# Patient Record
Sex: Female | Born: 1970
Health system: Southern US, Community
[De-identification: ages and names within clinical notes are randomized; demographics above are authoritative.]

## PROBLEM LIST (undated history)

## (undated) DIAGNOSIS — E079 Disorder of thyroid, unspecified: Secondary | ICD-10-CM

## (undated) DIAGNOSIS — Z87442 Personal history of urinary calculi: Secondary | ICD-10-CM

## (undated) DIAGNOSIS — D649 Anemia, unspecified: Secondary | ICD-10-CM

## (undated) DIAGNOSIS — Z8619 Personal history of other infectious and parasitic diseases: Secondary | ICD-10-CM

## (undated) DIAGNOSIS — T7840XA Allergy, unspecified, initial encounter: Secondary | ICD-10-CM

## (undated) HISTORY — DX: Anemia, unspecified: D64.9

## (undated) HISTORY — PX: HERNIA REPAIR: SHX51

## (undated) HISTORY — PX: TONSILLECTOMY AND ADENOIDECTOMY: SHX28

## (undated) HISTORY — DX: Disorder of thyroid, unspecified: E07.9

## (undated) HISTORY — DX: Personal history of urinary calculi: Z87.442

## (undated) HISTORY — PX: INCISION AND DRAINAGE RETROPHYARYNGEAL ABCESS: SHX281

## (undated) HISTORY — PX: ENDOMETRIAL BIOPSY: SHX622

## (undated) HISTORY — DX: Allergy, unspecified, initial encounter: T78.40XA

## (undated) HISTORY — PX: WISDOM TOOTH EXTRACTION: SHX21

## (undated) HISTORY — DX: Personal history of other infectious and parasitic diseases: Z86.19

---

## 2004-03-23 ENCOUNTER — Other Ambulatory Visit: Admission: RE | Admit: 2004-03-23 | Discharge: 2004-03-23 | Payer: Self-pay | Admitting: Obstetrics and Gynecology

## 2004-09-23 HISTORY — PX: KNEE ARTHROSCOPY WITH ANTERIOR CRUCIATE LIGAMENT (ACL) REPAIR: SHX5644

## 2014-01-27 ENCOUNTER — Ambulatory Visit (INDEPENDENT_AMBULATORY_CARE_PROVIDER_SITE_OTHER): Payer: 59 | Admitting: Family Medicine

## 2014-01-27 VITALS — BP 118/70 | HR 64 | Temp 98.6°F | Resp 16 | Ht 63.0 in | Wt 154.9 lb

## 2014-01-27 DIAGNOSIS — J329 Chronic sinusitis, unspecified: Secondary | ICD-10-CM

## 2014-01-27 DIAGNOSIS — E039 Hypothyroidism, unspecified: Secondary | ICD-10-CM | POA: Insufficient documentation

## 2014-01-27 DIAGNOSIS — D509 Iron deficiency anemia, unspecified: Secondary | ICD-10-CM | POA: Insufficient documentation

## 2014-01-27 MED ORDER — AMOXICILLIN 875 MG PO TABS: 875.0000 mg | ORAL_TABLET | Freq: Two times a day (BID) | ORAL | Status: DC

## 2014-01-27 NOTE — Progress Notes (Signed)
   Subjective:    Patient ID: Alice PentonRobin Salomon, female    DOB: [birth year], 43 y.o.   MRN: [id number]  HPI Patient presents today with 11 day history cough, head pressure, is feeling worse, with headache over eyes and fatigue. Occasional cough- non productive. Felt bad for 4-5 days then felt better and is now feeling worse.  Occasionally has seasonal allergies. Eyes burning, watery. Took OTC sinus preparation with decongestant and acetaminophen.  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  Review of Systems No sore throat, no fever, feels congested, no chest pain, no wheeze, no SOB, +post nasal drainage    Objective:   Physical Exam  Vitals reviewed. Constitutional: She is oriented to person, place, and time. She appears well-developed and well-nourished.  HENT:  Head: Normocephalic and atraumatic.  Right Ear: Tympanic membrane, external ear and ear canal normal.  Left Ear: Tympanic membrane, external ear and ear canal normal.  Nose: Mucosal edema and rhinorrhea present. Right sinus exhibits no maxillary sinus tenderness and no frontal sinus tenderness. Left sinus exhibits no maxillary sinus tenderness and no frontal sinus tenderness.  Mouth/Throat: Uvula is midline and mucous membranes are normal. Oropharyngeal exudate present. No posterior oropharyngeal edema, posterior oropharyngeal erythema or tonsillar abscesses.  Eyes: Conjunctivae are normal. Right eye exhibits no discharge. Left eye exhibits no discharge.  Neck: Normal range of motion. Neck supple.  Cardiovascular: Normal rate, regular rhythm and normal heart sounds.   Pulmonary/Chest: Effort normal and breath sounds normal.  Musculoskeletal: Normal range of motion.  Lymphadenopathy:    She has no cervical adenopathy.  Neurological: She is alert and oriented to person, place, and time.  Skin: Skin is warm and  dry.  Psychiatric: She has a normal mood and affect. Her behavior is normal. Judgment and thought content normal.       Assessment & Plan:  1. Sinus infection -This could be viral, but with patient's tobacco use, 10+ day history and improvement followed by worsening, will cover with antibiotic. - amoxicillin (AMOXIL) 875 MG tablet; Take 1 tablet (875 mg total) by mouth 2 (two) times daily.  Dispense: 20 tablet; Refill: 0 - Patient Instructions  Sudafed- 1 tablet in morning 1 tablet after lunch as needed for congestion Ibuprofen 400 mg every 8 hours as need for pain/headache  -RTC if no improvement in 3-5 days with antibiotics/OTCs or if worsening sympotms.  Emi Belfasteborah B. Kamayah Pillay, FNP-BC  [facility], [facility]  01/27/2014 5:54 PM

## 2014-01-27 NOTE — Patient Instructions (Signed)
Sudafed- 1 tablet in morning 1 tablet after lunch as needed for congestion Ibuprofen 400 mg every 8 hours as need for pain/headache

## 2014-01-28 NOTE — Progress Notes (Signed)
I have discussed this case with Ms. Gessner, NP and agree.  

## 2015-01-30 ENCOUNTER — Telehealth: Payer: Self-pay | Admitting: *Deleted

## 2015-01-30 NOTE — Telephone Encounter (Signed)
Unable to reach patient at time of Pre-Visit Call.  Left message for patient to return call when available.    

## 2015-01-31 ENCOUNTER — Ambulatory Visit (INDEPENDENT_AMBULATORY_CARE_PROVIDER_SITE_OTHER): Payer: 59 | Admitting: Family

## 2015-01-31 ENCOUNTER — Encounter: Payer: Self-pay | Admitting: Family

## 2015-01-31 VITALS — BP 118/80 | HR 66 | Temp 98.0°F | Resp 16 | Ht 63.75 in | Wt 156.0 lb

## 2015-01-31 DIAGNOSIS — E039 Hypothyroidism, unspecified: Secondary | ICD-10-CM

## 2015-01-31 DIAGNOSIS — D509 Iron deficiency anemia, unspecified: Secondary | ICD-10-CM | POA: Diagnosis not present

## 2015-01-31 DIAGNOSIS — B002 Herpesviral gingivostomatitis and pharyngotonsillitis: Secondary | ICD-10-CM

## 2015-01-31 DIAGNOSIS — Z720 Tobacco use: Secondary | ICD-10-CM | POA: Diagnosis not present

## 2015-01-31 MED ORDER — VALACYCLOVIR HCL 1 G PO TABS: ORAL_TABLET | ORAL | Status: DC

## 2015-01-31 MED ORDER — VARENICLINE TARTRATE 0.5 MG X 11 & 1 MG X 42 PO MISC: ORAL | Status: DC

## 2015-01-31 NOTE — Assessment & Plan Note (Signed)
Attempt trial of chantix when she is ready.  Common side effects including rare risk of suicide ideation was discussed with the patient today.  Patient is instructed to go directly to the ED if this occurs.  We discussed that patient can continue to smoke for 1 week after starting chantix, but then must discontinue cigarettes.  He is also instructed to contact us prior to completion of the starter month pack for an rx for the continuation month pack.  5 minutes spent with patient today on tobacco cessation counseling.

## 2015-01-31 NOTE — Patient Instructions (Signed)
Please complete lab work prior to leaving. Schedule physical at the front desk.  Welcome to [facility]!

## 2015-01-31 NOTE — Assessment & Plan Note (Signed)
Follow up lab work that she brings today shows normal H/H. She now has an IUD and this has decreased her previously heavy menstrual bleeding.

## 2015-01-31 NOTE — Assessment & Plan Note (Signed)
Will refill valtrex 

## 2015-01-31 NOTE — Progress Notes (Signed)
Subjective:    Patient ID: Alice Thomas, female    DOB: May 21, 1971, 44 y.o.   MRN: [id number]  HPI   Alice Thomas is a 44 yr old female who presents today to establish care.   Pmhx is significant for the following:  1) Hypothyroid-  Maintained on levothyroxine. Pt reports that she has been on synthroid for about 5 years ago.    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  Has cold sore every 3-4 months  3) Anemia- Was having having heavy periods   Reports in 1993 she had wisdom teeth removed and developed neck abscess which required hospitalized x 9 days.   4) tobacco abuse-  She is contemplating quitting.   Review of Systems  Constitutional:       Some weight gain in last 6 months.  Wt Readings from Last 3 Encounters: 01/31/15 : 156 lb (70.761 kg) 01/27/14 : 154 lb 14.4 oz (70.262 kg)   HENT: Negative for hearing loss and rhinorrhea.   Eyes: Negative for visual disturbance.  Respiratory: Negative for cough and shortness of breath.   Cardiovascular: Negative for chest pain.  Gastrointestinal: Negative for diarrhea and constipation.  Genitourinary: Negative for dysuria, frequency and menstrual problem.  Musculoskeletal: Negative for myalgias and arthralgias.  Skin: Negative for rash.  Neurological: Negative for headaches.  Hematological: Negative for adenopathy.  Psychiatric/Behavioral:       See PHQ-9       Past Medical History  Diagnosis Date  . Anemia   . Thyroid disease     hypothyroidism  . History of kidney stones   . History of chicken pox     childhood  . Allergy     seasonal    History   Social History  . Marital Status: Divorced    Spouse Name: N/A  . Number of Children: N/A  . Years of Education: N/A   Occupational History  . Not on file.   Social History Main Topics  . Smoking status: Current Every Day Smoker -- 0.50 packs/day for 10 years    Types: Cigarettes  . Smokeless tobacco: Not on file  . Alcohol Use: 0.0 - 2.4 oz/week    0-4  Standard drinks or equivalent per week  . Drug Use: No  . Sexual Activity: Not on file   Other Topics Concern  . Not on file   Social History Narrative   Rep for [employer]   Divorced, married x 7 years   Has boyfriend   No children   No pets   Enjoys volunteering and exercise but has not being doing     Past Surgical History  Procedure Laterality Date  . Tonsillectomy and adenoidectomy      childhood  . Incision and drainage retrophyaryngeal abcess  1992 / 93  . Wisdom tooth extraction  1992 / 93  . Knee arthroscopy with anterior cruciate ligament (acl) repair Right 2006  . Hernia repair      416 months of age ? abdominal  . Endometrial biopsy      Family History  Problem Relation Age of Onset  . Crohn's disease Father     No Known Allergies  Current Outpatient Prescriptions on File Prior to Visit  Medication Sig Dispense Refill  . levothyroxine (SYNTHROID, LEVOTHROID) 100 MCG tablet Take 100 mcg by mouth daily before breakfast.     No current facility-administered medications on file prior to visit.    BP 118/80 mmHg  Pulse 66  Temp(Src) 98  F (36.7 C) (Oral)  Resp 16  Ht 5' 3.75" (1.619 m)  Wt 156 lb (70.761 kg)  BMI 27.00 kg/m2  SpO2 99%    Objective:   Physical Exam  Constitutional: She is oriented to person, place, and time. She appears well-developed and well-nourished. No distress.  HENT:  Head: Normocephalic and atraumatic.  Cardiovascular: Normal rate and regular rhythm.   No murmur heard. Pulmonary/Chest: Effort normal and breath sounds normal. No respiratory distress. She has no wheezes. She has no rales. She exhibits no tenderness.  Lymphadenopathy:    She has no cervical adenopathy.  Neurological: She is alert and oriented to person, place, and time.  Skin: Skin is warm and dry.  Psychiatric: She has a normal mood and affect. Her behavior is normal. Judgment and thought content normal.          Assessment & Plan:

## 2015-01-31 NOTE — Assessment & Plan Note (Addendum)
Has had some weight gain. Obtain follow up TSH. Continue synthroid.

## 2015-02-04 LAB — TSH: TSH: 5.957 u[IU]/mL — ABNORMAL HIGH (ref 0.350–4.500)

## 2015-02-05 ENCOUNTER — Telehealth: Payer: Self-pay | Admitting: Family

## 2015-02-05 DIAGNOSIS — E039 Hypothyroidism, unspecified: Secondary | ICD-10-CM

## 2015-02-05 MED ORDER — LEVOTHYROXINE SODIUM 125 MCG PO TABS: 125.0000 ug | ORAL_TABLET | Freq: Every day | ORAL | Status: DC

## 2015-02-05 NOTE — Telephone Encounter (Signed)
Please let pt know that lab work shows that her thyroid medication needs to be increased.  Increase synthroid form to , repeat tsh in 6 weeks, dx hypothyroid.

## 2015-02-07 NOTE — Telephone Encounter (Signed)
Notified pt and scheduled lab appt for 03/21/15 at 9am. Future order [facility] for [facility] as pt's labs must go through [facility].

## 2015-03-16 ENCOUNTER — Telehealth: Payer: Self-pay | Admitting: Family

## 2015-03-16 NOTE — Telephone Encounter (Signed)
Pre visit letter mailed 03/15/15 °

## 2015-03-21 ENCOUNTER — Other Ambulatory Visit (INDEPENDENT_AMBULATORY_CARE_PROVIDER_SITE_OTHER): Payer: 59

## 2015-03-21 DIAGNOSIS — E039 Hypothyroidism, unspecified: Secondary | ICD-10-CM | POA: Diagnosis not present

## 2015-03-21 LAB — TSH: TSH: 12.372 u[IU]/mL — AB (ref 0.350–4.500)

## 2015-03-22 ENCOUNTER — Telehealth: Payer: Self-pay | Admitting: Family

## 2015-03-22 DIAGNOSIS — E039 Hypothyroidism, unspecified: Secondary | ICD-10-CM

## 2015-03-22 MED ORDER — LEVOTHYROXINE SODIUM 150 MCG PO TABS: 150.0000 ug | ORAL_TABLET | Freq: Every day | ORAL | Status: DC

## 2015-03-22 NOTE — Telephone Encounter (Signed)
Thyroid med needs to be increased based on lab results from 125 to 150 mcg. Repeat tsh in 6 weeks, dx hypothyroid. (rx sent)

## 2015-03-23 NOTE — Telephone Encounter (Signed)
Patient returned phone call. Best [phone]

## 2015-03-23 NOTE — Telephone Encounter (Signed)
Called patient at [phone] [facility]) and left message for patient to return call.

## 2015-03-23 NOTE — Telephone Encounter (Signed)
Patient notified, stated understanding.  Lab ordered and lab visit scheduled.

## 2015-04-04 ENCOUNTER — Telehealth: Payer: Self-pay | Admitting: *Deleted

## 2015-04-04 ENCOUNTER — Encounter: Payer: Self-pay | Admitting: *Deleted

## 2015-04-04 NOTE — Telephone Encounter (Signed)
Pre-Visit Call completed with patient and chart updated.   Pre-Visit Info documented in Specialty Comments under [employer].    

## 2015-04-05 ENCOUNTER — Ambulatory Visit (INDEPENDENT_AMBULATORY_CARE_PROVIDER_SITE_OTHER): Payer: 59 | Admitting: Family

## 2015-04-05 ENCOUNTER — Encounter: Payer: Self-pay | Admitting: Family

## 2015-04-05 VITALS — BP 118/76 | HR 80 | Temp 98.0°F | Resp 16 | Ht 63.75 in | Wt 162.8 lb

## 2015-04-05 DIAGNOSIS — N63 Unspecified lump in unspecified breast: Secondary | ICD-10-CM

## 2015-04-05 DIAGNOSIS — N912 Amenorrhea, unspecified: Secondary | ICD-10-CM | POA: Diagnosis not present

## 2015-04-05 DIAGNOSIS — Z0000 Encounter for general adult medical examination without abnormal findings: Secondary | ICD-10-CM

## 2015-04-05 DIAGNOSIS — Z720 Tobacco use: Secondary | ICD-10-CM

## 2015-04-05 LAB — BASIC METABOLIC PANEL
BUN: 12 mg/dL (ref 6–23)
CO2: 27 meq/L (ref 19–32)
Calcium: 9.6 mg/dL (ref 8.4–10.5)
Chloride: 104 mEq/L (ref 96–112)
Creat: 0.73 mg/dL (ref 0.50–1.10)
GLUCOSE: 95 mg/dL (ref 70–99)
POTASSIUM: 4 meq/L (ref 3.5–5.3)
Sodium: 140 mEq/L (ref 135–145)

## 2015-04-05 LAB — CBC WITH DIFFERENTIAL/PLATELET
BASOS PCT: 1 % (ref 0–1)
Basophils Absolute: 0.1 10*3/uL (ref 0.0–0.1)
EOS PCT: 0 % (ref 0–5)
Eosinophils Absolute: 0 10*3/uL (ref 0.0–0.7)
HEMATOCRIT: 43.7 % (ref 36.0–46.0)
Hemoglobin: 14.2 g/dL (ref 12.0–15.0)
Lymphocytes Relative: 24 % (ref 12–46)
Lymphs Abs: 1.4 10*3/uL (ref 0.7–4.0)
MCH: 29.8 pg (ref 26.0–34.0)
MCHC: 32.5 g/dL (ref 30.0–36.0)
MCV: 91.8 fL (ref 78.0–100.0)
MPV: 9.9 fL (ref 8.6–12.4)
Monocytes Absolute: 0.2 10*3/uL (ref 0.1–1.0)
Monocytes Relative: 4 % (ref 3–12)
NEUTROS ABS: 4.2 10*3/uL (ref 1.7–7.7)
NEUTROS PCT: 71 % (ref 43–77)
Platelets: 250 10*3/uL (ref 150–400)
RBC: 4.76 MIL/uL (ref 3.87–5.11)
RDW: 13.6 % (ref 11.5–15.5)
WBC: 5.9 10*3/uL (ref 4.0–10.5)

## 2015-04-05 LAB — LIPID PANEL
CHOL/HDL RATIO: 3 ratio
Cholesterol: 234 mg/dL — ABNORMAL HIGH (ref 0–200)
HDL: 79 mg/dL (ref >=46)
LDL Cholesterol: 143 mg/dL — ABNORMAL HIGH (ref 0–99)
TRIGLYCERIDES: 59 mg/dL (ref <150)
VLDL: 12 mg/dL (ref 0–40)

## 2015-04-05 LAB — HEPATIC FUNCTION PANEL
ALT: 9 U/L (ref 0–35)
AST: 12 U/L (ref 0–37)
Albumin: 4.1 g/dL (ref 3.5–5.2)
Alkaline Phosphatase: 36 U/L — ABNORMAL LOW (ref 39–117)
BILIRUBIN DIRECT: 0.1 mg/dL (ref 0.0–0.3)
Indirect Bilirubin: 0.5 mg/dL (ref 0.2–1.2)
Total Bilirubin: 0.6 mg/dL (ref 0.2–1.2)
Total Protein: 6.4 g/dL (ref 6.0–8.3)

## 2015-04-05 NOTE — Patient Instructions (Addendum)
You will be contacted about your referral to the breast center for mammogram. Please complete lab work at [facility]. Work on adding exercise, eating healthier. Complete upcoming thyroid testing as scheduled.  Let me know if abdominal bloating worsens or does not improve.

## 2015-04-05 NOTE — Progress Notes (Signed)
Subjective:    Patient ID: Alice Thomas, female    DOB: [birth year], 44 y.o.   MRN: [id number]  HPI  Patient presents today for complete physical.  Immunizations: tetanus 2011 Diet: reports fair diet Exercise: reports not regularly exercising.   Pap Smear: 2015- last year, per GYN.  Mammogram: 2014 Wt Readings from Last 3 Encounters:  04/05/15 162 lb 12.8 oz (73.846 kg)  01/31/15 156 lb (70.761 kg)  01/27/14 154 lb 14.4 oz (70.262 kg)       Review of Systems  Constitutional: Positive for unexpected weight change.  HENT: Negative for rhinorrhea.   Respiratory: Negative for cough and shortness of breath.   Cardiovascular: Negative for chest pain.  Gastrointestinal: Negative for nausea and diarrhea.       + bloating, denies N/V/D  Genitourinary: Negative for dysuria and frequency.  Musculoskeletal: Negative for myalgias and arthralgias.  Skin: Negative for rash.  Neurological: Negative for headaches.  Hematological: Negative for adenopathy.  Psychiatric/Behavioral:       Denies depression/anxiety   Past Medical History  Diagnosis Date  . Anemia   . Thyroid disease     hypothyroidism  . History of kidney stones   . History of chicken pox     childhood  . Allergy     seasonal    History   Social History  . Marital Status: Divorced    Spouse Name: N/A  . Number of Children: N/A  . Years of Education: N/A   Occupational History  . Not on file.   Social History Main Topics  . Smoking status: Current Every Day Smoker -- 0.50 packs/day for 10 years    Types: Cigarettes  . Smokeless tobacco: Not on file  . Alcohol Use: 0.0 - 2.4 oz/week    0-4 Standard drinks or equivalent per week  . Drug Use: No  . Sexual Activity: Not on file   Other Topics Concern  . 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 Tonsillectomy and adenoidectomy      childhood  . Incision and drainage retrophyaryngeal abcess  1992 / 93  . Wisdom tooth extraction  1992 / 93  . Knee arthroscopy with anterior cruciate ligament (acl) repair Right 2006  . Hernia repair      69 months of age ? abdominal  . Endometrial biopsy      Family History  Problem Relation Age of Onset  . Crohn's disease Father     No Known Allergies  Current Outpatient Prescriptions on File Prior to Visit  Medication Sig Dispense Refill  . levothyroxine (SYNTHROID, LEVOTHROID) 150 MCG tablet Take 1 tablet (150 mcg total) by mouth daily. 30 tablet 3  . valACYclovir (VALTREX) 1000 MG tablet 2 g twice daily for 1 day (separate doses by ~12 hours) (Patient not taking: Reported on 04/05/2015) 10 tablet 5  . varenicline (CHANTIX STARTING MONTH PAK) 0.5 MG X 11 & 1 MG X 42 tablet Take one 0.5 mg tablet by mouth once daily for 3 days, then increase to one 0.5 mg tablet twice daily for 4 days, then increase to one 1 mg tablet twice daily. (Patient not taking: Reported on 04/05/2015) 53 tablet 0   No current  facility-administered medications on file prior to visit.    BP 118/76 mmHg  Pulse 80  Temp(Src) 98 F (36.7 C) (Oral)  Resp 16  Ht 5' 3.75" (1.619 m)  Wt 162 lb 12.8 oz (73.846 kg)  BMI 28.17 kg/m2  SpO2 99%         Objective:   Physical Exam   Physical Exam  Constitutional: She is oriented to person, place, and time. She appears well-developed and well-nourished. No distress.  HENT:  Head: Normocephalic and atraumatic.  Right Ear: Tympanic membrane and ear canal normal.  Left Ear: Tympanic membrane and ear canal normal.  Mouth/Throat: Oropharynx is clear and moist.  Eyes: Pupils are equal, round, and reactive to light. No scleral icterus.  Neck: Normal range of motion. No thyromegaly present.  Cardiovascular: Normal rate and regular rhythm.   No murmur heard. Pulmonary/Chest:  Effort normal and breath sounds normal. No respiratory distress. He has no wheezes. She has no rales. She exhibits no tenderness.  Abdominal: Soft. Bowel sounds are normal. He exhibits no distension and no mass. There is mild diffuse tenderness. There is no rebound and no guarding.  Musculoskeletal: She exhibits no edema.  Lymphadenopathy:    She has no cervical adenopathy.  Neurological: She is alert and oriented to person, place, and time. She has normal reflexes. She exhibits normal muscle tone. Coordination normal.  Skin: Skin is warm and dry.  Psychiatric: She has a normal mood and affect. Her behavior is normal. Judgment and thought content normal.  Breasts: Examined lying Right: Without masses, retractions, discharge or axillary adenopathy.  Left: left breast with some thickening left upper outer quadrant Pelvic: deferred          Assessment & Plan:        Assessment & Plan:  EKG tracing is personally reviewed.  EKG notes NSR.  No acute changes.   Breast Mass- left, refer for diagnostic mammo and ultrasound.   Amenorrhea- pt has IUD, concerned about abdominal bloating due possibly to pregnancy.  Obtain HCG. Discussed increasing fresh fruits/veggies and letting me know if abdominal bloating worsens or does not improve. ? Related to constipation.

## 2015-04-05 NOTE — Progress Notes (Signed)
Pre visit review using our clinic review tool, if applicable. No additional management support is needed unless otherwise documented below in the visit note. 

## 2015-04-06 ENCOUNTER — Encounter: Payer: Self-pay | Admitting: Family

## 2015-04-06 LAB — URINALYSIS, ROUTINE W REFLEX MICROSCOPIC
Bilirubin Urine: NEGATIVE
Glucose, UA: NEGATIVE mg/dL
Hgb urine dipstick: NEGATIVE
KETONES UR: NEGATIVE mg/dL
LEUKOCYTES UA: NEGATIVE
Nitrite: NEGATIVE
Protein, ur: NEGATIVE mg/dL
Specific Gravity, Urine: 1.007 (ref 1.005–1.030)
Urobilinogen, UA: 0.2 mg/dL (ref 0.0–1.0)
pH: 7 (ref 5.0–8.0)

## 2015-04-06 LAB — VITAMIN D 25 HYDROXY (VIT D DEFICIENCY, FRACTURES): VIT D 25 HYDROXY: 39 ng/mL (ref 30–100)

## 2015-04-06 LAB — PREGNANCY, URINE: PREG TEST UR: NEGATIVE

## 2015-04-07 DIAGNOSIS — Z0000 Encounter for general adult medical examination without abnormal findings: Secondary | ICD-10-CM | POA: Insufficient documentation

## 2015-04-07 NOTE — Assessment & Plan Note (Signed)
Discussed tobacco cessation 

## 2015-04-07 NOTE — Assessment & Plan Note (Signed)
Immunizations reviewed and up to date. Discussed healthy diet, exercise weight loss. Obtain routine labs.

## 2015-04-10 ENCOUNTER — Telehealth: Payer: Self-pay | Admitting: Family

## 2015-04-10 NOTE — Telephone Encounter (Signed)
Notified pt per 04/06/15 lab letter.

## 2015-04-10 NOTE — Telephone Encounter (Signed)
Caller name:Milady Relation to pt: Call back number: [phone] Pharmacy:  Reason for call:   Requesting last lab results

## 2015-05-04 ENCOUNTER — Other Ambulatory Visit (INDEPENDENT_AMBULATORY_CARE_PROVIDER_SITE_OTHER): Payer: 59

## 2015-05-04 DIAGNOSIS — E039 Hypothyroidism, unspecified: Secondary | ICD-10-CM | POA: Diagnosis not present

## 2015-05-04 LAB — TSH: TSH: 1.27 u[IU]/mL (ref 0.350–4.500)

## 2015-05-05 ENCOUNTER — Encounter: Payer: Self-pay | Admitting: Family

## 2015-06-07 ENCOUNTER — Telehealth: Payer: Self-pay | Admitting: *Deleted

## 2015-06-07 MED ORDER — LEVOTHYROXINE SODIUM 150 MCG PO TABS: 150.0000 ug | ORAL_TABLET | Freq: Every day | ORAL | Status: DC

## 2015-06-07 NOTE — Telephone Encounter (Signed)
Received fax from [facility] for 90 day supply of levothyroxine.  Rx sent.

## 2015-10-20 ENCOUNTER — Telehealth: Payer: Self-pay | Admitting: Family

## 2015-10-20 ENCOUNTER — Ambulatory Visit
Admission: RE | Admit: 2015-10-20 | Discharge: 2015-10-20 | Disposition: A | Discharge status: Home / Self Care | Payer: No Typology Code available for payment source | Source: Ambulatory Visit | Attending: Family | Admitting: Family

## 2015-10-20 DIAGNOSIS — N63 Unspecified lump in unspecified breast: Secondary | ICD-10-CM

## 2015-10-20 NOTE — Telephone Encounter (Signed)
Left message for patient to call about Flu Shot °

## 2015-11-16 NOTE — Telephone Encounter (Signed)
Pt states she received flu shot in 07/2015 at [employer] (employer)

## 2015-11-17 NOTE — Telephone Encounter (Signed)
Abstracted into patient's chart.

## 2016-03-01 ENCOUNTER — Other Ambulatory Visit: Payer: Self-pay | Admitting: Family

## 2016-04-19 ENCOUNTER — Ambulatory Visit (INDEPENDENT_AMBULATORY_CARE_PROVIDER_SITE_OTHER): Payer: BLUE CROSS/BLUE SHIELD | Admitting: Family

## 2016-04-19 ENCOUNTER — Encounter: Payer: Self-pay | Admitting: Family

## 2016-04-19 VITALS — BP 120/80 | HR 74 | Temp 98.3°F | Resp 18 | Ht 63.25 in | Wt 160.2 lb

## 2016-04-19 DIAGNOSIS — F411 Generalized anxiety disorder: Secondary | ICD-10-CM

## 2016-04-19 DIAGNOSIS — Z0000 Encounter for general adult medical examination without abnormal findings: Secondary | ICD-10-CM

## 2016-04-19 DIAGNOSIS — Z720 Tobacco use: Secondary | ICD-10-CM | POA: Diagnosis not present

## 2016-04-19 MED ORDER — VARENICLINE TARTRATE 0.5 MG X 11 & 1 MG X 42 PO MISC: ORAL | 0 refills | Status: DC

## 2016-04-19 MED ORDER — VALACYCLOVIR HCL 1 G PO TABS: ORAL_TABLET | ORAL | 5 refills | Status: DC

## 2016-04-19 MED ORDER — ESCITALOPRAM OXALATE 10 MG PO TABS: ORAL_TABLET | ORAL | 0 refills | Status: DC

## 2016-04-19 NOTE — Progress Notes (Signed)
Subjective:    Patient ID: Alice Thomas, female    DOB: 1971/08/26, 45 y.o.   MRN: [id number]  HPI  Patient presents today for complete physical.  Immunizations: up to date Diet: fair diet Exercise:  Not consistent Pap Smear:4/15 [facility]  Mammogram: 10/20/15  Wt Readings from Last 3 Encounters:  04/19/16 160 lb 3.2 oz (72.7 kg)  04/05/15 162 lb 12.8 oz (73.8 kg)  01/31/15 156 lb (70.8 kg)   Anxiety- has a lot of stress at work.  Gets bent out of shap over "every little thing at [employer]. "  Feels "ocd" about problems/issues.   Tobacco abuse- 1/2 PPD to 3/4 PPD. Wants to improve her help.    Review of Systems  Constitutional: Positive for fatigue. Negative for unexpected weight change.  HENT: Negative for hearing loss and rhinorrhea.   Eyes: Negative for visual disturbance.  Respiratory: Negative for cough and shortness of breath.   Cardiovascular: Negative for chest pain.  Gastrointestinal: Negative for constipation and diarrhea.  Genitourinary: Negative for dysuria, frequency and menstrual problem.  Musculoskeletal: Negative for arthralgias and myalgias.  Skin: Negative for rash.  Neurological: Negative for headaches.  Hematological: Negative for adenopathy.  Psychiatric/Behavioral:       See HPI       Past Medical History:  Diagnosis Date  . Allergy    seasonal  . Anemia   . History of chicken pox    childhood  . History of kidney stones   . Thyroid disease    hypothyroidism     Social History   Social History  . Marital status: Divorced    Spouse name: N/A  . Number of children: N/A  . Years of education: N/A   Occupational History  . Not on file.   Social History Main Topics  . Smoking status: Current Every Day Smoker    Packs/day: 0.50    Years: 10.00    Types: Cigarettes  . Smokeless tobacco: Never Used  . Alcohol use 0.0 - 2.4 oz/week  . Drug use: No  . Sexual activity: Not on file   Other Topics Concern  . 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 ENDOMETRIAL BIOPSY    . HERNIA REPAIR     40 months of age ? abdominal  . INCISION AND DRAINAGE RETROPHYARYNGEAL ABCESS  1992 / 93  . KNEE ARTHROSCOPY WITH ANTERIOR CRUCIATE LIGAMENT (ACL) REPAIR Right 2006  . TONSILLECTOMY AND ADENOIDECTOMY     childhood  . WISDOM TOOTH EXTRACTION  1992 / 32    Family History  Problem Relation Age of Onset  . Crohn's disease Father   . Alzheimer's disease Father   . Parkinson's disease Father     No Known Allergies  Current Outpatient Prescriptions on File Prior to Visit  Medication Sig Dispense Refill  . levothyroxine (SYNTHROID, LEVOTHROID) 150 MCG tablet TAKE 1 TABLET (150 MCG TOTAL) BY MOUTH DAILY. 90 tablet 0   No current facility-administered medications on file prior to visit.     BP 120/80   Pulse 74   Temp 98.3 F (36.8 C) (Oral)   Resp 18   Ht 5' 3.25" (1.607 m)   Wt 160 lb 3.2  oz (72.7 kg)   SpO2 98% Comment: room air  BMI 28.15 kg/m    Objective:   Physical Exam  Physical Exam  Constitutional: She is oriented to person, place, and time. She appears well-developed and well-nourished. No distress.  HENT:  Head: Normocephalic and atraumatic.  Right Ear: Tympanic membrane and ear canal normal.  Left Ear: Tympanic membrane and ear canal normal.  Mouth/Throat: Oropharynx is clear and moist.  Eyes: Pupils are equal, round, and reactive to light. No scleral icterus.  Neck: Normal range of motion. No thyromegaly present.  Cardiovascular: Normal rate and regular rhythm.   No murmur heard. Pulmonary/Chest: Effort normal and breath sounds normal. No respiratory distress. He has no wheezes. She has no rales. She exhibits no tenderness.  Abdominal: Soft. Bowel sounds are normal. She exhibits  no distension and no mass. There is no tenderness. There is no rebound and no guarding.  Musculoskeletal: She exhibits no edema.  Lymphadenopathy:    She has no cervical adenopathy.  Neurological: She is alert and oriented to person, place, and time. She has normal patellar reflexes. She exhibits normal muscle tone. Coordination normal.  Skin: Skin is warm and dry.  Psychiatric: She has a normal mood and affect. Her behavior is normal. Judgment and thought content normal.  Breasts: Examined lying Right: Without masses, retractions, discharge or axillary adenopathy.  Left: Without masses, retractions, discharge or axillary adenopathy.          Assessment & Plan:         Assessment & Plan:  EKG tracing is personally reviewed.  EKG notes NSR.  No acute changes.

## 2016-04-19 NOTE — Assessment & Plan Note (Signed)
Uncontrolled.  Will give trial of lexapro 10mg . I instructed pt to start 1/2 tablet once daily for 1 week and then increase to a full tablet once daily on week two as tolerated.  We discussed common side effects such as nausea, drowsiness and weight gain.  Also discussed rare but serious side effect of suicide ideation.  She is instructed to discontinue medication go directly to ED if this occurs.  Pt verbalizes understanding.  Plan follow up in 1 month to evaluate progress.

## 2016-04-19 NOTE — Patient Instructions (Addendum)
Please complete lab work prior to leaving. Begin lexapro 10mg .  1/2 tab once daily for 1 week, increase to a full tab once daily.21/14/7-mg regimen: 21-mg patch once daily for 6 weeks, then 14-mg patch once daily for 2 weeks, then 7-mg patch once daily for 2 weeks. Work on [employer] and exercise.

## 2016-04-19 NOTE — Progress Notes (Signed)
Pre visit review using our clinic review tool, if applicable. No additional management support is needed unless otherwise documented below in the visit note. 

## 2016-04-19 NOTE — Assessment & Plan Note (Addendum)
Discussed healthy diet, exercise, weight loss. Will be due for pap next year. Immunizations reviewed and up to date. Obtain routine lab work. She uses tanning beds. Discussed risks of tanning beds/sun exposure.

## 2016-04-19 NOTE — Assessment & Plan Note (Signed)
Discussed use of the nicotine patch.

## 2016-05-17 ENCOUNTER — Other Ambulatory Visit: Payer: Self-pay | Admitting: Family

## 2016-05-24 ENCOUNTER — Ambulatory Visit (INDEPENDENT_AMBULATORY_CARE_PROVIDER_SITE_OTHER): Payer: BLUE CROSS/BLUE SHIELD | Admitting: Family

## 2016-05-24 ENCOUNTER — Encounter: Payer: Self-pay | Admitting: Family

## 2016-05-24 VITALS — BP 112/81 | HR 92 | Temp 98.4°F | Resp 16 | Ht 63.0 in | Wt 153.8 lb

## 2016-05-24 DIAGNOSIS — Z0000 Encounter for general adult medical examination without abnormal findings: Secondary | ICD-10-CM | POA: Diagnosis not present

## 2016-05-24 DIAGNOSIS — F411 Generalized anxiety disorder: Secondary | ICD-10-CM | POA: Diagnosis not present

## 2016-05-24 DIAGNOSIS — R350 Frequency of micturition: Secondary | ICD-10-CM

## 2016-05-24 LAB — HEPATIC FUNCTION PANEL
ALK PHOS: 40 U/L (ref 33–115)
ALT: 7 U/L (ref 6–29)
AST: 10 U/L (ref 10–35)
Albumin: 4.6 g/dL (ref 3.6–5.1)
BILIRUBIN DIRECT: 0.1 mg/dL (ref <=0.2)
BILIRUBIN INDIRECT: 0.4 mg/dL (ref 0.2–1.2)
Total Bilirubin: 0.5 mg/dL (ref 0.2–1.2)
Total Protein: 6.9 g/dL (ref 6.1–8.1)

## 2016-05-24 LAB — CBC WITH DIFFERENTIAL/PLATELET
BASOS PCT: 0 %
Basophils Absolute: 0 cells/uL (ref 0–200)
EOS ABS: 106 {cells}/uL (ref 15–500)
Eosinophils Relative: 2 %
HEMATOCRIT: 45.7 % — AB (ref 35.0–45.0)
HEMOGLOBIN: 14.9 g/dL (ref 11.7–15.5)
LYMPHS ABS: 1537 {cells}/uL (ref 850–3900)
LYMPHS PCT: 29 %
MCH: 29.6 pg (ref 27.0–33.0)
MCHC: 32.6 g/dL (ref 32.0–36.0)
MCV: 90.9 fL (ref 80.0–100.0)
MONO ABS: 530 {cells}/uL (ref 200–950)
MPV: 10 fL (ref 7.5–12.5)
Monocytes Relative: 10 %
NEUTROS ABS: 3127 {cells}/uL (ref 1500–7800)
Neutrophils Relative %: 59 %
Platelets: 247 10*3/uL (ref 140–400)
RBC: 5.03 MIL/uL (ref 3.80–5.10)
RDW: 13.1 % (ref 11.0–15.0)
WBC: 5.3 10*3/uL (ref 3.8–10.8)

## 2016-05-24 LAB — LIPID PANEL
Cholesterol: 187 mg/dL (ref 125–200)
HDL: 69 mg/dL (ref >=46)
LDL Cholesterol: 104 mg/dL (ref <130)
TRIGLYCERIDES: 68 mg/dL (ref <150)
Total CHOL/HDL Ratio: 2.7 Ratio (ref <=5.0)
VLDL: 14 mg/dL (ref <30)

## 2016-05-24 LAB — BASIC METABOLIC PANEL
BUN: 12 mg/dL (ref 7–25)
CHLORIDE: 104 mmol/L (ref 98–110)
CO2: 29 mmol/L (ref 20–31)
Calcium: 10 mg/dL (ref 8.6–10.2)
Creat: 0.94 mg/dL (ref 0.50–1.10)
Glucose, Bld: 84 mg/dL (ref 65–99)
POTASSIUM: 4.5 mmol/L (ref 3.5–5.3)
SODIUM: 138 mmol/L (ref 135–146)

## 2016-05-24 MED ORDER — ESCITALOPRAM OXALATE 10 MG PO TABS: ORAL_TABLET | ORAL | 1 refills | Status: DC

## 2016-05-24 NOTE — Addendum Note (Signed)
Addended by: Cammy CopaHANDLER, Socorro Kanitz N on: 05/24/2016 10:11 AM   Modules accepted: Orders

## 2016-05-24 NOTE — Progress Notes (Signed)
Pre visit review using our clinic review tool, if applicable. No additional management support is needed unless otherwise documented below in the visit note. 

## 2016-05-24 NOTE — Addendum Note (Signed)
Addended by: Cammy CopaHANDLER, Kashira Behunin N on: 05/24/2016 11:33 AM   Modules accepted: Orders

## 2016-05-24 NOTE — Patient Instructions (Addendum)
Continue lexapro.  Complete lab work prior to leaving.

## 2016-05-24 NOTE — Progress Notes (Signed)
Subjective:    Patient ID: Alice Thomas, female    DOB: [birth year], 45 y.o.   MRN: [id number]  HPI  Ms. Alice Thomas is a 45 yr old female who presents today for follow up of her anxiety. Last visit she described feeling "ocd" and getting easily "bent out of shape."  Last visit she was started on lexapro.   Wt Readings from Last 3 Encounters:  05/24/16 153 lb 12.8 oz (69.8 kg)  04/19/16 160 lb 3.2 oz (72.7 kg)  04/05/15 162 lb 12.8 oz (73.8 kg)   Reports OCD feeling is much better.  Not getting as bent out of shape.    Urinary frequency.  Denies dysuria, denies fever.   Review of Systems    see HPI  Past Medical History:  Diagnosis Date  . Allergy    seasonal  . Anemia   . History of chicken pox    childhood  . History of kidney stones   . Thyroid disease    hypothyroidism     Social History   Social History  . Marital status: Divorced    Spouse name: N/A  . Number of children: N/A  . Years of education: N/A   Occupational History  . Not on file.   Social History Main Topics  . Smoking status: Current Every Day Smoker    Packs/day: 0.50    Years: 10.00    Types: Cigarettes  . Smokeless tobacco: Never Used  . Alcohol use 0.0 - 2.4 oz/week  . Drug use: No  . Sexual activity: Not on file   Other Topics Concern  . Not on file   Social History Narrative   Rep for [employer]   Divorced, married x 7 years   Has boyfriend   No children   No pets   Enjoys volunteering and exercise but has not being doing     Past Surgical History:  Procedure Laterality Date  . ENDOMETRIAL BIOPSY    . HERNIA REPAIR     166 months of age ? abdominal  . INCISION AND DRAINAGE RETROPHYARYNGEAL ABCESS  1992 / 93  . KNEE ARTHROSCOPY WITH ANTERIOR CRUCIATE LIGAMENT (ACL) REPAIR Right 2006  . TONSILLECTOMY AND ADENOIDECTOMY     childhood  . WISDOM TOOTH EXTRACTION  1992 / 7093    Family History  Problem Relation Age of Onset  . Crohn's disease Father   . Alzheimer's disease Father    . Parkinson's disease Father     No Known Allergies  Current Outpatient Prescriptions on File Prior to Visit  Medication Sig Dispense Refill  . levothyroxine (SYNTHROID, LEVOTHROID) 150 MCG tablet TAKE 1 TABLET (150 MCG TOTAL) BY MOUTH DAILY. 90 tablet 0  . valACYclovir (VALTREX) 1000 MG tablet 2 g twice daily for 1 day (separate doses by ~12 hours) 10 tablet 5   No current facility-administered medications on file prior to visit.     BP 112/81 (BP Location: Left Arm, Patient Position: Sitting, Cuff Size: Normal)   Pulse 92   Temp 98.4 F (36.9 C) (Oral)   Resp 16   Ht 5\' 3"  (1.6 m)   Wt 153 lb 12.8 oz (69.8 kg)   SpO2 100%   BMI 27.24 kg/m    Objective:   Physical Exam  Constitutional: She is oriented to person, place, and time. She appears well-developed and well-nourished.  Cardiovascular: Normal rate, regular rhythm and normal heart sounds.   No murmur heard. Pulmonary/Chest: Effort normal and breath sounds normal. No respiratory  distress. She has no wheezes.  Neurological: She is alert and oriented to person, place, and time.  Psychiatric: She has a normal mood and affect. Her behavior is normal. Judgment and thought content normal.          Assessment & Plan:  Declines flu shot today, will get at work.    Urinary frequency- check UA and culture to rule out UTI.

## 2016-05-24 NOTE — Assessment & Plan Note (Signed)
Improved on lexapro, continue same. Commended pt on her weight loss.

## 2016-05-25 LAB — URINALYSIS, ROUTINE W REFLEX MICROSCOPIC
BILIRUBIN URINE: NEGATIVE
GLUCOSE, UA: NEGATIVE
Hgb urine dipstick: NEGATIVE
Ketones, ur: NEGATIVE
Leukocytes, UA: NEGATIVE
Nitrite: NEGATIVE
Protein, ur: NEGATIVE
SPECIFIC GRAVITY, URINE: 1.012 (ref 1.001–1.035)
pH: 7 (ref 5.0–8.0)

## 2016-05-25 LAB — TSH: TSH: 0.08 m[IU]/L — AB

## 2016-05-26 LAB — URINE CULTURE

## 2016-05-28 ENCOUNTER — Telehealth: Payer: Self-pay | Admitting: Family

## 2016-05-28 DIAGNOSIS — E039 Hypothyroidism, unspecified: Secondary | ICD-10-CM

## 2016-05-28 MED ORDER — LEVOTHYROXINE SODIUM 137 MCG PO TABS: 137.0000 ug | ORAL_TABLET | Freq: Every day | ORAL | 2 refills | Status: DC

## 2016-05-28 NOTE — Telephone Encounter (Signed)
Notified pt and she voices understanding. Lab order entered. 

## 2016-05-28 NOTE — Telephone Encounter (Signed)
Please contact patient and let her know that her lab work shows that we need to decrease synthroid.  I have sent rx for 137 mcg. She should repeat tsh in 6 weeks, dx hypothyroid. Urine, kidneys, sugar, blood count, liver function and cholesterol all look good.

## 2016-06-02 ENCOUNTER — Other Ambulatory Visit: Payer: Self-pay | Admitting: Family

## 2016-07-09 ENCOUNTER — Other Ambulatory Visit: Payer: BLUE CROSS/BLUE SHIELD

## 2016-07-10 ENCOUNTER — Other Ambulatory Visit (INDEPENDENT_AMBULATORY_CARE_PROVIDER_SITE_OTHER): Payer: BLUE CROSS/BLUE SHIELD

## 2016-07-10 DIAGNOSIS — E039 Hypothyroidism, unspecified: Secondary | ICD-10-CM

## 2016-07-11 LAB — TSH: TSH: 0.44 mIU/L

## 2016-08-28 ENCOUNTER — Other Ambulatory Visit: Payer: Self-pay | Admitting: Family

## 2016-12-31 ENCOUNTER — Other Ambulatory Visit: Payer: Self-pay | Admitting: Family

## 2016-12-31 NOTE — Telephone Encounter (Signed)
Pt is due for follow up please call and schedule appointment.  

## 2017-01-14 ENCOUNTER — Other Ambulatory Visit: Payer: Self-pay | Admitting: Family

## 2017-01-14 DIAGNOSIS — H52222 Regular astigmatism, left eye: Secondary | ICD-10-CM | POA: Diagnosis not present

## 2017-01-14 DIAGNOSIS — H5213 Myopia, bilateral: Secondary | ICD-10-CM | POA: Diagnosis not present

## 2017-01-14 DIAGNOSIS — H524 Presbyopia: Secondary | ICD-10-CM | POA: Diagnosis not present

## 2017-01-14 NOTE — Telephone Encounter (Signed)
Alice Thomas-- lexapro refill was sent to pharmacy. Pt last seen by you on 05/24/16 and has no future appts scheduled. When should she follow up?

## 2017-01-15 NOTE — Telephone Encounter (Signed)
I will plan to see her for a 1 year follow up.

## 2017-01-15 NOTE — Telephone Encounter (Signed)
My chart message sent to pt.

## 2017-04-29 ENCOUNTER — Ambulatory Visit (INDEPENDENT_AMBULATORY_CARE_PROVIDER_SITE_OTHER): Payer: BLUE CROSS/BLUE SHIELD | Admitting: Family Medicine

## 2017-04-29 ENCOUNTER — Encounter: Payer: Self-pay | Admitting: Family Medicine

## 2017-04-29 ENCOUNTER — Ambulatory Visit (HOSPITAL_BASED_OUTPATIENT_CLINIC_OR_DEPARTMENT_OTHER)
Admission: RE | Admit: 2017-04-29 | Discharge: 2017-04-29 | Disposition: A | Discharge status: Home / Self Care | Payer: BLUE CROSS/BLUE SHIELD | Source: Ambulatory Visit | Attending: Family Medicine | Admitting: Family Medicine

## 2017-04-29 ENCOUNTER — Telehealth: Payer: Self-pay | Admitting: Family Medicine

## 2017-04-29 VITALS — BP 132/89 | HR 90 | Temp 98.1°F | Resp 20 | Wt 167.0 lb

## 2017-04-29 DIAGNOSIS — R350 Frequency of micturition: Secondary | ICD-10-CM | POA: Diagnosis not present

## 2017-04-29 DIAGNOSIS — R1030 Lower abdominal pain, unspecified: Secondary | ICD-10-CM | POA: Diagnosis not present

## 2017-04-29 DIAGNOSIS — N200 Calculus of kidney: Secondary | ICD-10-CM | POA: Diagnosis not present

## 2017-04-29 DIAGNOSIS — R319 Hematuria, unspecified: Secondary | ICD-10-CM

## 2017-04-29 LAB — POC URINALSYSI DIPSTICK (AUTOMATED)
BILIRUBIN UA: NEGATIVE
Glucose, UA: NEGATIVE
KETONES UA: NEGATIVE
LEUKOCYTES UA: NEGATIVE
Nitrite, UA: NEGATIVE
Protein, UA: NEGATIVE
Spec Grav, UA: 1.015 (ref 1.010–1.025)
Urobilinogen, UA: 0.2 E.U./dL
pH, UA: 7 (ref 5.0–8.0)

## 2017-04-29 NOTE — Telephone Encounter (Signed)
Please call patient: Her abdominal x-ray did have a fair amount of stool burden present. It also showed a possible very small kidney stone left kidney, likely not the cause of her discomfort given her symptoms, but cannot rule out out. - I would suggest she follow the recommendations provided during her visit of hydrating, MiraLAX and align probiotic.  - We'll wait for urine culture to be resulted, if needed will prescribe antibiotic. - If culture is negative and symptoms do not resolve or at least improve with the above regimen within 1 week, would want to follow-up with further evaluation and imaging.

## 2017-04-29 NOTE — Patient Instructions (Addendum)
Start Miralax 1 cap in 8 ounces daily until bowels move well.  Take in 80 ounces of water a day.  You may need to try ALIGN probiotic daily. Get Xray of abdomen today at [facility].   I will send urine for culture and call you with results.  Look for triggers of irritation off of list provided.    Breathe more fresh air, less smoke!!! :)    If everything comes back normal and symptoms are not resolved with BM, then will need to investigate further.     Interstitial Cystitis Interstitial cystitis is a condition that causes inflammation of the bladder. The bladder is a hollow organ in the lower part of your abdomen. It stores urine after the urine is made by your kidneys. With interstitial cystitis, you may have pain in the bladder area. You may also have a frequent and urgent need to urinate. The severity of interstitial cystitis can vary from person to person. You may have flare-ups of the condition, and then it may go away for a while. For many people who have this condition, it becomes a long-term problem. What are the causes? The cause of this condition is not known. What increases the risk? This condition is more likely to develop in women. What are the signs or symptoms? Symptoms of interstitial cystitis vary, and they can change over time. Symptoms may include:  Discomfort or pain in the bladder area. This can range from mild to severe. The pain may change in intensity as the bladder fills with urine or as it empties.  Pelvic pain.  An urgent need to urinate.  Frequent urination.  Pain during sexual intercourse.  Pinpoint bleeding on the bladder wall.  For women, the symptoms often get worse during menstruation. How is this diagnosed? This condition is diagnosed by evaluating your symptoms and ruling out other causes. A physical exam will be done. Various tests may be done to rule out other conditions. Common tests include:  Urine tests.  Cystoscopy. In this test,  a tool that is like a very thin telescope is used to look into your bladder.  Biopsy. This involves taking a sample of tissue from the bladder wall to be examined under a microscope.  How is this treated? There is no cure for interstitial cystitis, but treatment methods are available to control your symptoms. Work closely with your health care provider to find the treatments that will be most effective for you. Treatment options may include:  Medicines to relieve pain and to help reduce the number of times that you feel the need to urinate.  Bladder training. This involves learning ways to control when you urinate, such as: ? Urinating at scheduled times. ? Training yourself to delay urination. ? Doing exercises (Kegel exercises) to strengthen the muscles that control urine flow.  Lifestyle changes, such as changing your diet or taking steps to control stress.  Use of a device that provides electrical stimulation in order to reduce pain.  A procedure that stretches your bladder by filling it with air or fluid.  Surgery. This is rare. It is only done for extreme cases if other treatments do not help.  Follow these instructions at home:  Take medicines only as directed by your health care provider.  Use bladder training techniques as directed. ? Keep a bladder diary to find out which foods, liquids, or activities make your symptoms worse. ? Use your bladder diary to schedule bathroom trips. If you are away from  home, plan to be near a bathroom at each of your scheduled times. ? Make sure you urinate just before you leave the house and just before you go to bed.  Do Kegel exercises as directed by your health care provider.  Do not drink alcohol.  Do not use any tobacco products, including cigarettes, chewing tobacco, or electronic cigarettes. If you need help quitting, ask your health care provider.  Make dietary changes as directed by your health care provider. You may need to  avoid spicy foods and foods that contain a high amount of potassium.  Limit your drinking of beverages that stimulate urination. These include soda, coffee, and tea.  Keep all follow-up visits as directed by your health care provider. This is important. Contact a health care provider if:  Your symptoms do not get better after treatment.  Your pain and discomfort are getting worse.  You have more frequent urges to urinate.  You have a fever. Get help right away if:  You are not able to control your bladder at all. This information is not intended to replace advice given to you by your health care provider. Make sure you discuss any questions you have with your health care provider. Document Released: 05/10/2004 Document Revised: 02/15/2016 Document Reviewed: 05/17/2014 Elsevier Interactive Patient Education  [employer].

## 2017-04-29 NOTE — Progress Notes (Signed)
Alice PentonRobin Thomas , [birth year], 46 y.o., female MRN: [id number] Patient Care Team    Relationship Specialty Notifications Start End  [name]'Sullivan, Melissa, NP PCP - General Internal Medicine  01/31/15     Chief Complaint  Patient presents with  . Urinary Frequency    bladder pressure x 2 days      Subjective: Pt presents for an OV with complaints of Urinary frequency of 2 days duration.  Associated symptoms include bladder pressure. Patient states she was at the beach last week, and has noticed that her bowels had not been moving as well as usual. She has had urinary tract infections in the past, and the pressure she is experiencing feels similar to prior UTI. She also feels she has had increase in nighttime urinary frequency. She does have a history of kidney stone, which she states this does not feel similar at home she denies fear, chills nausea, vomit, diarrhea. She does have IUD placement. She reports history of uterine fibroids. She had bilateral inguinal hernia repair as a baby. She is a smoker.   Depression screen PHQ 2/9 01/31/2015  Decreased Interest 2  Down, Depressed, Hopeless 0  PHQ - 2 Score 2  Altered sleeping 0  Tired, decreased energy 0  Change in appetite 0  Feeling bad or failure about yourself  0  Trouble concentrating 1  Moving slowly or fidgety/restless 0  Suicidal thoughts 0  PHQ-9 Score 3    No Known Allergies Social History  Substance Use Topics  . Smoking status: Current Every Day Smoker    Packs/day: 0.50    Years: 10.00    Types: Cigarettes  . Smokeless tobacco: Never Used  . Alcohol use 0.0 - 2.4 oz/week   Past Medical History:  Diagnosis Date  . Allergy    seasonal  . Anemia   . History of chicken pox    childhood  . History of kidney stones   . Thyroid disease    hypothyroidism   Past Surgical History:  Procedure Laterality Date  . ENDOMETRIAL BIOPSY    . HERNIA REPAIR     836 months of age ? abdominal  . INCISION AND DRAINAGE  RETROPHYARYNGEAL ABCESS  1992 / 93  . KNEE ARTHROSCOPY WITH ANTERIOR CRUCIATE LIGAMENT (ACL) REPAIR Right 2006  . TONSILLECTOMY AND ADENOIDECTOMY     childhood  . WISDOM TOOTH EXTRACTION  1992 / 6093   Family History  Problem Relation Age of Onset  . Crohn's disease Father   . Alzheimer's disease Father   . Parkinson's disease Father    Allergies as of 04/29/2017   No Known Allergies     Medication List       Accurate as of 04/29/17 11:59 PM. Always use your most recent med list.          escitalopram 10 MG tablet Commonly known as:  LEXAPRO 1 TABLET BY MOUTH ONCE DAILY   levothyroxine 137 MCG tablet Commonly known as:  SYNTHROID, LEVOTHROID TAKE 1 TABLET (137 MCG TOTAL) BY MOUTH DAILY BEFORE BREAKFAST.   valACYclovir 1000 MG tablet Commonly known as:  VALTREX 2 g twice daily for 1 day (separate doses by ~12 hours)       All past medical history, surgical history, allergies, family history, immunizations andmedications were updated in the EMR today and reviewed under the history and medication portions of their EMR.     ROS: Negative, with the exception of above mentioned in HPI   Objective:  BP 132/89 (  BP Location: Right Arm, Patient Position: Sitting, Cuff Size: Normal)   Pulse 90   Temp 98.1 F (36.7 C)   Resp 20   Wt 167 lb (75.8 kg)   SpO2 96%   BMI 29.58 kg/m  Body mass index is 29.58 kg/m. Gen: Afebrile. No acute distress. Nontoxic in appearance, well developed, well nourished.  HENT: AT. [place].MMM, no oral lesions.  Eyes:Pupils Equal Round Reactive to light, Extraocular movements intact,  Conjunctiva without redness, discharge or icterus. CV: RRR  Chest: CTAB, no wheeze or crackles.  Abd: Soft. Mild suprapubic tenderness, BS present. Left suprapubic fullness, with mild tenderness ,  No rebound or guarding.  MSK: No CVA tenderness bilaterally Skin: No rashes, purpura or petechiae.  Neuro:  Normal gait. PERLA. EOMi. Alert. Oriented x3  No exam data  present No results found. No results found for this or any previous visit (from the past 24 hour(s)).  Assessment/Plan: Alice Thomas is a 46 y.o. female present for OV for  Urinary frequency Lower abdominal pain Hematuria, unspecified type - Uncertain etiology of discomfort. She does have a fullness with tenderness in the left lower abdomen just above the suprapubic area. Her point-of-care urine is normal, with the exception of a trace RBC, doubt this is a urinary tract infection or kidney stone. We'll send for urine culture to be complete and order abdominal x-ray. She did have what felt like a moderate stool burden on exam. - Abdominal x-ray ordered - Given lack of normal bowel movement, suggested MiraLAX 1 cap in 8 ounces of water daily. She is to make sure she is getting 80 ounces of water in her diet a day. Start align Probiotic. - She was given a list of common bladder irritants to review, and try to avoid or see if any potential triggers. - Patient was encouraged to stop smoking. - Discussed the possible need of further evaluation and imaging if symptoms persist after above recommendations given her fibroid and smoking history. Of course patient was encouraged to return sooner if symptoms worsen.   Reviewed expectations re: course of current medical issues.  Discussed self-management of symptoms.  Outlined signs and symptoms indicating need for more acute intervention.  Patient verbalized understanding and all questions were answered.  Patient received an After-Visit Summary.    Orders Placed This Encounter  Procedures  . Urine Culture  . DG Abd 2 Views  . POCT Urinalysis Dipstick (Automated)     Note is dictated utilizing voice recognition software. Although note has been proof read prior to signing, occasional typographical errors still can be missed. If any questions arise, please do not hesitate to call for verification.   electronically signed by:  Felix Pacini,  DO   Primary Care - [place]

## 2017-05-01 ENCOUNTER — Telehealth: Payer: Self-pay | Admitting: Family Medicine

## 2017-05-01 LAB — URINE CULTURE: Organism ID, Bacteria: NO GROWTH

## 2017-05-01 NOTE — Telephone Encounter (Signed)
Please call pt: - urine culture negative, no signs of infection

## 2017-05-01 NOTE — Telephone Encounter (Signed)
Detailed message left on voice mail, okay per DPR.  

## 2017-05-01 NOTE — Telephone Encounter (Signed)
Patient notified and verbalized understanding. 

## 2017-05-21 ENCOUNTER — Other Ambulatory Visit (HOSPITAL_COMMUNITY)
Admission: RE | Admit: 2017-05-21 | Discharge: 2017-05-21 | Disposition: A | Discharge status: Home / Self Care | Payer: BLUE CROSS/BLUE SHIELD | Source: Ambulatory Visit | Attending: Family | Admitting: Family

## 2017-05-21 ENCOUNTER — Ambulatory Visit (INDEPENDENT_AMBULATORY_CARE_PROVIDER_SITE_OTHER): Payer: BLUE CROSS/BLUE SHIELD | Admitting: Family

## 2017-05-21 ENCOUNTER — Encounter: Payer: Self-pay | Admitting: Family

## 2017-05-21 VITALS — BP 119/75 | HR 75 | Temp 98.5°F | Resp 16 | Ht 63.5 in | Wt 163.4 lb

## 2017-05-21 DIAGNOSIS — Z01419 Encounter for gynecological examination (general) (routine) without abnormal findings: Secondary | ICD-10-CM | POA: Diagnosis not present

## 2017-05-21 DIAGNOSIS — Z720 Tobacco use: Secondary | ICD-10-CM | POA: Diagnosis not present

## 2017-05-21 DIAGNOSIS — Z0001 Encounter for general adult medical examination with abnormal findings: Secondary | ICD-10-CM

## 2017-05-21 DIAGNOSIS — Z0000 Encounter for general adult medical examination without abnormal findings: Secondary | ICD-10-CM | POA: Diagnosis not present

## 2017-05-21 DIAGNOSIS — R102 Pelvic and perineal pain: Secondary | ICD-10-CM | POA: Diagnosis not present

## 2017-05-21 LAB — CBC WITH DIFFERENTIAL/PLATELET
Basophils Absolute: 44 cells/uL (ref 0–200)
Basophils Relative: 1 %
EOS PCT: 2 %
Eosinophils Absolute: 88 cells/uL (ref 15–500)
HCT: 42.4 % (ref 35.0–45.0)
HEMOGLOBIN: 14 g/dL (ref 11.7–15.5)
LYMPHS ABS: 1540 {cells}/uL (ref 850–3900)
Lymphocytes Relative: 35 %
MCH: 30.2 pg (ref 27.0–33.0)
MCHC: 33 g/dL (ref 32.0–36.0)
MCV: 91.4 fL (ref 80.0–100.0)
MPV: 9.7 fL (ref 7.5–12.5)
Monocytes Absolute: 484 cells/uL (ref 200–950)
Monocytes Relative: 11 %
NEUTROS ABS: 2244 {cells}/uL (ref 1500–7800)
NEUTROS PCT: 51 %
Platelets: 224 10*3/uL (ref 140–400)
RBC: 4.64 MIL/uL (ref 3.80–5.10)
RDW: 13.1 % (ref 11.0–15.0)
WBC: 4.4 10*3/uL (ref 3.8–10.8)

## 2017-05-21 LAB — LIPID PANEL
Cholesterol: 202 mg/dL — ABNORMAL HIGH (ref <200)
HDL: 70 mg/dL (ref >50)
LDL CALC: 117 mg/dL — AB (ref <100)
TRIGLYCERIDES: 73 mg/dL (ref <150)
Total CHOL/HDL Ratio: 2.9 Ratio (ref <5.0)
VLDL: 15 mg/dL (ref <30)

## 2017-05-21 LAB — URINALYSIS, ROUTINE W REFLEX MICROSCOPIC
BILIRUBIN URINE: NEGATIVE
Glucose, UA: NEGATIVE
HGB URINE DIPSTICK: NEGATIVE
KETONES UR: NEGATIVE
Leukocytes, UA: NEGATIVE
Nitrite: NEGATIVE
PROTEIN: NEGATIVE
Specific Gravity, Urine: 1.007 (ref 1.001–1.035)
pH: 6 (ref 5.0–8.0)

## 2017-05-21 LAB — HEPATIC FUNCTION PANEL
ALK PHOS: 39 U/L (ref 33–115)
ALT: 8 U/L (ref 6–29)
AST: 13 U/L (ref 10–35)
Albumin: 4.4 g/dL (ref 3.6–5.1)
BILIRUBIN DIRECT: 0.1 mg/dL (ref <=0.2)
BILIRUBIN INDIRECT: 0.3 mg/dL (ref 0.2–1.2)
TOTAL PROTEIN: 6.5 g/dL (ref 6.1–8.1)
Total Bilirubin: 0.4 mg/dL (ref 0.2–1.2)

## 2017-05-21 LAB — BASIC METABOLIC PANEL
BUN: 13 mg/dL (ref 7–25)
CALCIUM: 9.9 mg/dL (ref 8.6–10.2)
CO2: 26 mmol/L (ref 20–32)
Chloride: 106 mmol/L (ref 98–110)
Creat: 0.85 mg/dL (ref 0.50–1.10)
GLUCOSE: 97 mg/dL (ref 65–99)
Potassium: 4.5 mmol/L (ref 3.5–5.3)
SODIUM: 140 mmol/L (ref 135–146)

## 2017-05-21 MED ORDER — VARENICLINE TARTRATE 0.5 MG X 11 & 1 MG X 42 PO MISC: ORAL | 0 refills | Status: DC

## 2017-05-21 NOTE — Patient Instructions (Addendum)
Please complete lab work prior to leaving. Continue to work on [employer] diet, exercise and weight loss.   Please let me know how you are doing on the chantix after about 3 weeks  Good luck quitting smoking.

## 2017-05-21 NOTE — Progress Notes (Signed)
Subjective:    Patient ID: Alice Thomas, female    DOB: November 16, 1970, 46 y.o.   MRN: [id number]  HPI  Patient presents today for complete physical.  Immunizations: tetanus 2011 Diet: needs improvment Exercise: was doing well for a while, plans to resume exercise.  Pap Smear: 4/15 Mammogram: 10/20/15 Vision: up to date Dental: up to date  Wt Readings from Last 3 Encounters:  05/21/17 163 lb 6.4 oz (74.1 kg)  04/29/17 167 lb (75.8 kg)  05/24/16 153 lb 12.8 oz (69.8 kg)   LLQ pain- saw Dr. Claiborne Billings 8/7 for this.  KUB unremarkable except for potential small left renal calculus. Reports improvement in her symptoms.   Tobacco abuse- continues to smoke. Interested in quitting.   Review of Systems  Constitutional: Negative for unexpected weight change.  HENT: Negative for hearing loss and rhinorrhea.   Eyes: Negative for visual disturbance.  Respiratory: Negative for cough.   Cardiovascular: Negative for leg swelling.  Gastrointestinal: Negative for abdominal pain, constipation and diarrhea.  Genitourinary: Negative for dysuria and frequency.  Musculoskeletal: Negative for arthralgias and myalgias.  Neurological: Negative for headaches.  Hematological: Negative for adenopathy.  Psychiatric/Behavioral:       Denies depression. Some stress with moving.    Past Medical History:  Diagnosis Date  . Allergy    seasonal  . Anemia   . History of chicken pox    childhood  . History of kidney stones   . Thyroid disease    hypothyroidism     Social History   Social History  . Marital status: Divorced    Spouse name: N/A  . Number of children: N/A  . Years of education: N/A   Occupational History  . Not on file.   Social History Main Topics  . Smoking status: Current Every Day Smoker    Packs/day: 0.50    Years: 10.00    Types: Cigarettes  . Smokeless tobacco: Never Used  . Alcohol use 0.0 - 2.4 oz/week  . Drug use: No  . Sexual activity: Yes    Partners: Male   Birth control/ protection: IUD   Other Topics Concern  . 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 ENDOMETRIAL BIOPSY    . HERNIA REPAIR     94 months of age ? abdominal  . INCISION AND DRAINAGE RETROPHYARYNGEAL ABCESS  1992 / 93  . KNEE ARTHROSCOPY WITH ANTERIOR CRUCIATE LIGAMENT (ACL) REPAIR Right 2006  . TONSILLECTOMY AND ADENOIDECTOMY     childhood  . WISDOM TOOTH EXTRACTION  1992 / 77    Family History  Problem Relation Age of Onset  . Crohn's disease Father   . Alzheimer's disease Father   . Parkinson's disease Father     No Known Allergies  Current Outpatient Prescriptions on File Prior to Visit  Medication Sig Dispense Refill  . levothyroxine (SYNTHROID, LEVOTHROID) 137 MCG tablet TAKE 1 TABLET (137 MCG TOTAL) BY MOUTH DAILY BEFORE BREAKFAST. 30 tablet 5  . valACYclovir (VALTREX) 1000 MG tablet 2 g twice daily for 1 day (separate doses by ~12 hours) 10 tablet 5   No current facility-administered medications on file prior to visit.     BP 119/75 (BP Location: Right Arm,  Cuff Size: Normal)   Pulse 75   Temp 98.5 F (36.9 C) (Oral)   Resp 16   Ht 5' 3.5" (1.613 m)   Wt 163 lb 6.4 oz (74.1 kg)   SpO2 100%   BMI 28.49 kg/m        Objective:   Physical Exam  Physical Exam  Constitutional: She is oriented to person, place, and time. She appears well-developed and well-nourished. No distress.  HENT:  Head: Normocephalic and atraumatic.  Right Ear: Tympanic membrane and ear canal normal.  Left Ear: Tympanic membrane and ear canal normal.  Mouth/Throat: Oropharynx is clear and moist.  Eyes: Pupils are equal, round, and reactive to light. No scleral icterus.  Neck: Normal range of motion. No thyromegaly present.  Cardiovascular: Normal rate and regular  rhythm.   No murmur heard. Pulmonary/Chest: Effort normal and breath sounds normal. No respiratory distress. He has no wheezes. She has no rales. She exhibits no tenderness.  Abdominal: Soft. Bowel sounds are normal. She exhibits no distension and no mass. There is no tenderness. There is no rebound and no guarding.  Musculoskeletal: She exhibits no edema.  Lymphadenopathy:    She has no cervical adenopathy.  Neurological: She is alert and oriented to person, place, and time. She has normal patellar reflexes. She exhibits normal muscle tone. Coordination normal.  Skin: Skin is warm and dry.  Psychiatric: She has a normal mood and affect. Her behavior is normal. Judgment and thought content normal.  Breasts: Examined lying Right: Without masses, retractions, discharge or axillary adenopathy.  Left: Without masses, retractions, discharge or axillary adenopathy.  Inguinal/mons: Normal without inguinal adenopathy  External genitalia: Normal  BUS/Urethra/Skene's glands: Normal  Bladder: Normal  Vagina: Normal  Cervix: Normal, IUD string noted in OS Uterus: normal in size, shape and contour. Midline and mobile  Adnexa/parametria:  Rt: Without masses or tenderness.  Lt: mild tenderness. Mild adnexal fullness Anus and perineum: Normal            Assessment & Plan:   Preventative Care- Pap today with cotesting. Obtain routine lab work. Refer for mammogram.  Immunizations reviewed and up to date. EKG tracing is personally reviewed.  EKG notes NSR.  No acute changes.    Tobacco abuse- Trial of chantix. Common side effects including rare risk of suicide ideation was discussed with the patient today.  Patient is instructed to go directly to the ED if this occurs.  We discussed that patient can continue to smoke for 1 week after starting chantix, but then must discontinue cigarettes.  He is also instructed to contact us prior to completion of the starter month pack for an rx for the  continuation month pack.  5 minutes spent with patient today on tobacco cessation counseling.   L adnexal tenderness/fullness on exam- check pelvic US.      Assessment & Plan:

## 2017-05-22 LAB — TSH: TSH: 3.25 m[IU]/L

## 2017-05-27 LAB — CYTOLOGY - PAP
Adequacy: ABSENT
DIAGNOSIS: NEGATIVE
HPV: NOT DETECTED

## 2017-05-29 ENCOUNTER — Encounter: Payer: Self-pay | Admitting: Family

## 2017-05-29 MED ORDER — LEVOTHYROXINE SODIUM 137 MCG PO TABS: 137.0000 ug | ORAL_TABLET | Freq: Every day | ORAL | 5 refills | Status: DC

## 2017-07-10 ENCOUNTER — Ambulatory Visit (HOSPITAL_BASED_OUTPATIENT_CLINIC_OR_DEPARTMENT_OTHER): Payer: BLUE CROSS/BLUE SHIELD

## 2017-07-10 ENCOUNTER — Ambulatory Visit (HOSPITAL_BASED_OUTPATIENT_CLINIC_OR_DEPARTMENT_OTHER)
Admission: RE | Admit: 2017-07-10 | Discharge: 2017-07-10 | Disposition: A | Discharge status: Home / Self Care | Payer: BLUE CROSS/BLUE SHIELD | Source: Ambulatory Visit | Attending: Family | Admitting: Family

## 2017-07-10 DIAGNOSIS — Z1231 Encounter for screening mammogram for malignant neoplasm of breast: Secondary | ICD-10-CM | POA: Diagnosis not present

## 2017-07-10 DIAGNOSIS — Z0000 Encounter for general adult medical examination without abnormal findings: Secondary | ICD-10-CM

## 2017-07-29 ENCOUNTER — Encounter: Payer: Self-pay | Admitting: Family

## 2017-07-29 DIAGNOSIS — E039 Hypothyroidism, unspecified: Secondary | ICD-10-CM

## 2017-08-13 ENCOUNTER — Telehealth: Payer: Self-pay | Admitting: Family

## 2017-08-13 DIAGNOSIS — E039 Hypothyroidism, unspecified: Secondary | ICD-10-CM

## 2017-08-13 NOTE — Telephone Encounter (Signed)
Copied from CRM [id number]. Topic: Quick Communication - See Telephone Encounter >> Aug 13, 2017 10:35 AM Oneal GroutSebastian, Jennifer S wrote: CRM for notification. See Telephone encounter for:  [facility] Fax # [phone] 08/13/17.

## 2017-08-13 NOTE — Telephone Encounter (Signed)
Request for lab results to be faxed

## 2017-08-13 NOTE — Telephone Encounter (Signed)
Order faxed to [facility].

## 2017-08-18 ENCOUNTER — Telehealth: Payer: Self-pay | Admitting: *Deleted

## 2017-08-18 NOTE — Telephone Encounter (Signed)
Received out-of-range TSH results from [facility]; forwarded to provider/SLS 11/26

## 2017-08-21 ENCOUNTER — Other Ambulatory Visit: Payer: Self-pay | Admitting: Family

## 2017-08-21 NOTE — Telephone Encounter (Signed)
TSH mildly elevated at 6.37, I would recommend increase synthroid from 137 to 150 mcg and repeat TSH in 6 weeks. She uses [facility].

## 2017-08-22 NOTE — Telephone Encounter (Signed)
Please see other phone note with the results and further recs.

## 2017-08-22 NOTE — Telephone Encounter (Signed)
Results place on PCP's red folder to review, please advise.

## 2017-08-22 NOTE — Telephone Encounter (Signed)
Per Melissa your TSH is mildly elevated at 6.37 and she is recommending to increase your Levothyroxine dose to 150 mcg a day.she also wants to repeat the TSH in 6 weeks.   Please message me back when you read this message so we can send your new prescription, and fax the orders to your lab. Thanks.

## 2017-08-22 NOTE — Telephone Encounter (Signed)
Please see 08/13/17 phone note.

## 2017-08-22 NOTE — Telephone Encounter (Signed)
Per Melissa your TSH is mildly elevated at 6.37 and she is recommending to increase your Levothyroxine dose to 150 mcg a day.she also wants to repeat the TSH in 6 weeks.   Please message me back when you read this message so we can send your new prescription, and fax the orders to your lab. Thanks.  

## 2017-08-27 MED ORDER — LEVOTHYROXINE SODIUM 150 MCG PO TABS: 150.0000 ug | ORAL_TABLET | Freq: Every day | ORAL | 1 refills | Status: DC

## 2017-08-27 NOTE — Addendum Note (Signed)
Addended by: Mervin KungFERGERSON, Tarry Blayney A on: 08/27/2017 10:01 AM   Modules accepted: Orders

## 2017-10-24 ENCOUNTER — Other Ambulatory Visit: Payer: Self-pay | Admitting: Family

## 2017-11-10 ENCOUNTER — Encounter: Payer: Self-pay | Admitting: Medical

## 2017-11-10 ENCOUNTER — Ambulatory Visit (INDEPENDENT_AMBULATORY_CARE_PROVIDER_SITE_OTHER): Payer: BLUE CROSS/BLUE SHIELD | Admitting: Medical

## 2017-11-10 VITALS — BP 110/79 | HR 105 | Temp 98.2°F | Resp 16 | Ht 63.0 in | Wt 157.0 lb

## 2017-11-10 DIAGNOSIS — J0100 Acute maxillary sinusitis, unspecified: Secondary | ICD-10-CM | POA: Diagnosis not present

## 2017-11-10 DIAGNOSIS — R062 Wheezing: Secondary | ICD-10-CM

## 2017-11-10 DIAGNOSIS — R05 Cough: Secondary | ICD-10-CM

## 2017-11-10 DIAGNOSIS — R059 Cough, unspecified: Secondary | ICD-10-CM

## 2017-11-10 DIAGNOSIS — J40 Bronchitis, not specified as acute or chronic: Secondary | ICD-10-CM

## 2017-11-10 MED ORDER — VALACYCLOVIR HCL 1 G PO TABS: ORAL_TABLET | ORAL | 5 refills | Status: DC

## 2017-11-10 MED ORDER — AZITHROMYCIN 250 MG PO TABS: ORAL_TABLET | ORAL | 0 refills | Status: DC

## 2017-11-10 MED ORDER — FLUTICASONE PROPIONATE 50 MCG/ACT NA SUSP: 2.0000 | Freq: Every day | NASAL | 1 refills | Status: DC

## 2017-11-10 MED ORDER — ALBUTEROL SULFATE HFA 108 (90 BASE) MCG/ACT IN AERS: 2.0000 | INHALATION_SPRAY | Freq: Four times a day (QID) | RESPIRATORY_TRACT | 2 refills | Status: DC | PRN

## 2017-11-10 MED ORDER — BENZONATATE 100 MG PO CAPS: 100.0000 mg | ORAL_CAPSULE | Freq: Three times a day (TID) | ORAL | 0 refills | Status: DC | PRN

## 2017-11-10 NOTE — Patient Instructions (Addendum)
You do appear to be struggling with bronchitis and probable sinusitis.  Symptoms for about 2 weeks.  I am prescribing benzonatate for cough.  For nasal congestion Flonase.  Also for the probable infections prescribing a azithromycin.  You have very minimal transient wheeze on lung exam today.  If you note any persistent type wheezing then do recommend filling the albuterol inhaler.  Follow-up in 7-10 days or as needed.

## 2017-11-10 NOTE — Progress Notes (Signed)
Subjective:    Patient ID: Alice PentonRobin Thomas, female    DOB: [birth year], 47 y.o.   MRN: [id number]  HPI Pt in for some sinus pressure and nasal congestion. Pt states symptoms came on Feb 6,2018. She feels better but can't get over it completely.   Some residual sinus pressure, nasal congestion and productive cough.  No fever, no chills or sweats recently. Maybe fever at onset.   No bodyaches.  Pt has mirena.  Review of Systems  Constitutional: Negative for chills, fatigue and fever.       See hpi.  HENT: Positive for congestion, sinus pressure and sinus pain. Negative for sore throat and tinnitus.   Respiratory: Positive for cough. Negative for chest tightness, shortness of breath and wheezing.   Cardiovascular: Negative for chest pain and palpitations.  Gastrointestinal: Negative for abdominal pain.  Genitourinary: Negative for flank pain and frequency.  Skin: Negative for rash.  Neurological: Negative for dizziness, speech difficulty, weakness, numbness and headaches.  Hematological: Negative for adenopathy. Does not bruise/bleed easily.  Psychiatric/Behavioral: Negative for behavioral problems, confusion and hallucinations. The patient is not hyperactive.     Past Medical History:  Diagnosis Date  . Allergy    seasonal  . Anemia   . History of chicken pox    childhood  . History of kidney stones   . Thyroid disease    hypothyroidism     Social History   Socioeconomic History  . Marital status: Divorced    Spouse name: Not on file  . Number of children: Not on file  . Years of education: Not on file  . Highest education level: Not on file  Social Needs  . Financial resource strain: Not on file  . Food insecurity - worry: Not on file  . Food insecurity - inability: Not on file  . Transportation needs - medical: Not on file  . Transportation needs - non-medical: Not on file  Occupational History  . Not on file  Tobacco Use  . Smoking status: Current Every Day  Smoker    Packs/day: 0.50    Years: 10.00    Pack years: 5.00    Types: Cigarettes  . Smokeless tobacco: Never Used  Substance and Sexual Activity  . Alcohol use: Yes    Alcohol/week: 0.0 - 2.4 oz  . Drug use: No  . Sexual activity: Yes    Partners: Male    Birth control/protection: IUD  Other Topics Concern  . 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[employer]   Divorced, married x 7 years   Has boyfriend   No children   No pets   Enjoys volunteering and exercise but has not being doing     Past Surgical History:  Procedure Laterality Date  . ENDOMETRIAL BIOPSY    . HERNIA REPAIR     406 months of age ? abdominal  . INCISION AND DRAINAGE RETROPHYARYNGEAL ABCESS  1992 / 93  . KNEE ARTHROSCOPY WITH ANTERIOR CRUCIATE LIGAMENT (ACL) REPAIR Right 2006  . TONSILLECTOMY AND ADENOIDECTOMY     childhood  . WISDOM TOOTH EXTRACTION  1992 / 6393    Family History  Problem Relation Age of Onset  . Crohn's disease Father   . Alzheimer's disease Father   . Parkinson's disease Father     No Known Allergies  Current Outpatient Medications on File Prior to Visit  Medication Sig Dispense Refill  . levothyroxine (SYNTHROID, LEVOTHROID) 150 MCG tablet TAKE 1 TABLET BY  MOUTH EVERY DAY 30 tablet 3  . varenicline (CHANTIX STARTING MONTH PAK) 0.5 MG X 11 & 1 MG X 42 tablet Take one 0.5 mg tablet by mouth once daily for 3 days, then increase to one 0.5 mg tablet twice daily for 4 days, then increase to one 1 mg tablet twice daily. 53 tablet 0   No current facility-administered medications on file prior to visit.     BP 110/79   Pulse (!) 105   Temp 98.2 F (36.8 C) (Oral)   Resp 16   Ht 5\' 3"  (1.6 m)   Wt 157 lb (71.2 kg)   SpO2 99%   BMI 27.81 kg/m       Objective:   Physical Exam   General  Mental Status - Alert. General Appearance - Well groomed. Not in acute distress.  Skin Rashes- No Rashes.  HEENT Head- Normal. Ear Auditory Canal - Left- Normal. Right -  Normal.Tympanic Membrane- Left- Normal. Right- Normal. Eye Sclera/Conjunctiva- Left- Normal. Right- Normal. Nose & Sinuses Nasal Mucosa- Left-  Boggy and Congested. Right-  Boggy and  Congested.Bilateral maxillary and frontal sinus pressure. Mouth & Throat Lips: Upper Lip- Normal: no dryness, cracking, pallor, cyanosis, or vesicular eruption. Lower Lip-Normal: no dryness, cracking, pallor, cyanosis or vesicular eruption. Buccal Mucosa- Bilateral- No Aphthous ulcers. Oropharynx- No Discharge or Erythema. Tonsils: Characteristics- Bilateral- No Erythema or Congestion. Size/Enlargement- Bilateral- No enlargement. Discharge- bilateral-None.  Neck Neck- Supple. No Masses.   Chest and Lung Exam Auscultation: Breath Sounds:-Clear even and unlabored but faint minimal expiratory wheeze on auscultation.  Cardiovascular Auscultation:Rythm- Regular, rate and rhythm. Murmurs & Other Heart Sounds:Ausculatation of the heart reveal- No Murmurs.  Lymphatic Head & Neck General Head & Neck Lymphatics: Bilateral: Description- No Localized lymphadenopathy.      Assessment & Plan:  You do appear to be struggling with bronchitis and probable sinusitis.  Symptoms for about 2 weeks.  I am prescribing benzonatate for cough.  For nasal congestion Flonase.  Also for the probable infections prescribing a azithromycin.  You have very minimal transient wheeze on lung exam today.  If you note any persistent type wheezing then do recommend filling the albuterol inhaler.  Follow-up in 7-10 days or as needed.  Esperanza Richters, PA-C

## 2018-02-24 ENCOUNTER — Encounter: Payer: Self-pay | Admitting: Family

## 2018-02-24 ENCOUNTER — Other Ambulatory Visit: Payer: Self-pay | Admitting: Family

## 2018-02-24 DIAGNOSIS — E039 Hypothyroidism, unspecified: Secondary | ICD-10-CM

## 2018-02-25 MED ORDER — LEVOTHYROXINE SODIUM 150 MCG PO TABS: 150.0000 ug | ORAL_TABLET | Freq: Every day | ORAL | 0 refills | Status: DC

## 2018-03-02 ENCOUNTER — Encounter: Payer: Self-pay | Admitting: Family

## 2018-03-03 ENCOUNTER — Encounter: Payer: Self-pay | Admitting: Family

## 2018-03-03 DIAGNOSIS — E039 Hypothyroidism, unspecified: Secondary | ICD-10-CM

## 2018-03-12 DIAGNOSIS — H524 Presbyopia: Secondary | ICD-10-CM | POA: Diagnosis not present

## 2018-03-12 DIAGNOSIS — H52222 Regular astigmatism, left eye: Secondary | ICD-10-CM | POA: Diagnosis not present

## 2018-03-12 DIAGNOSIS — H5213 Myopia, bilateral: Secondary | ICD-10-CM | POA: Diagnosis not present

## 2018-03-30 ENCOUNTER — Other Ambulatory Visit (INDEPENDENT_AMBULATORY_CARE_PROVIDER_SITE_OTHER): Payer: BLUE CROSS/BLUE SHIELD

## 2018-03-30 ENCOUNTER — Other Ambulatory Visit: Payer: Self-pay | Admitting: Family

## 2018-03-30 DIAGNOSIS — E039 Hypothyroidism, unspecified: Secondary | ICD-10-CM

## 2018-03-30 NOTE — Telephone Encounter (Signed)
Melissa -- please advise refill?  Pt apparently had difficulty getting tsh done in June and just completed it in our office today.

## 2018-03-31 ENCOUNTER — Telehealth: Payer: Self-pay | Admitting: Family

## 2018-03-31 DIAGNOSIS — E039 Hypothyroidism, unspecified: Secondary | ICD-10-CM

## 2018-03-31 LAB — TSH: TSH: 0.18 u[IU]/mL — ABNORMAL LOW (ref 0.35–4.50)

## 2018-03-31 MED ORDER — LEVOTHYROXINE SODIUM 125 MCG PO TABS: 125.0000 ug | ORAL_TABLET | Freq: Every day | ORAL | 3 refills | Status: DC

## 2018-03-31 NOTE — Telephone Encounter (Signed)
TSH is low.  Need to decrease synthroid from 150 to 125 mcg and repeat tsh in 6 weeks.

## 2018-03-31 NOTE — Telephone Encounter (Signed)
My chart message sent to pt.

## 2018-05-15 DIAGNOSIS — H43312 Vitreous membranes and strands, left eye: Secondary | ICD-10-CM | POA: Diagnosis not present

## 2018-06-14 ENCOUNTER — Other Ambulatory Visit: Payer: Self-pay | Admitting: Family

## 2018-06-15 DIAGNOSIS — H43312 Vitreous membranes and strands, left eye: Secondary | ICD-10-CM | POA: Diagnosis not present

## 2018-08-19 ENCOUNTER — Other Ambulatory Visit: Payer: Self-pay | Admitting: Family

## 2018-08-22 ENCOUNTER — Telehealth: Payer: Self-pay | Admitting: Family

## 2018-08-22 NOTE — Telephone Encounter (Signed)
Please schedule follow up TSH.

## 2018-08-24 NOTE — Telephone Encounter (Signed)
Patient will be here tomorrow for tsh

## 2018-08-25 ENCOUNTER — Other Ambulatory Visit (INDEPENDENT_AMBULATORY_CARE_PROVIDER_SITE_OTHER): Payer: BLUE CROSS/BLUE SHIELD

## 2018-08-25 DIAGNOSIS — E039 Hypothyroidism, unspecified: Secondary | ICD-10-CM | POA: Diagnosis not present

## 2018-08-26 ENCOUNTER — Other Ambulatory Visit: Payer: Self-pay | Admitting: Family

## 2018-08-26 DIAGNOSIS — E039 Hypothyroidism, unspecified: Secondary | ICD-10-CM

## 2018-08-26 LAB — TSH: TSH: 32.67 mIU/L — ABNORMAL HIGH

## 2018-08-26 NOTE — Telephone Encounter (Signed)
Please ask pt if she has missed any doses of synthroid.  If not, we should increase dose back up to 150 mcg once daily please and repeat tsh in 6 weeks.

## 2018-08-27 MED ORDER — LEVOTHYROXINE SODIUM 150 MCG PO TABS: 150.0000 ug | ORAL_TABLET | Freq: Every day | ORAL | 0 refills | Status: DC

## 2018-08-27 NOTE — Telephone Encounter (Signed)
Pt. was alarmed by TSH level, questioning validity of quest result. Pt. stated she has been taking her synthroid daily. Nevertheless, pt .agreed to start the higher dosage, and scheduled a CPE with Melissa for 12/16. Future order for TSH placed. Melissa made aware.

## 2018-08-28 NOTE — Telephone Encounter (Signed)
Noted  

## 2018-09-07 ENCOUNTER — Encounter: Payer: Self-pay | Admitting: Family

## 2018-09-07 ENCOUNTER — Ambulatory Visit (INDEPENDENT_AMBULATORY_CARE_PROVIDER_SITE_OTHER): Payer: BLUE CROSS/BLUE SHIELD | Admitting: Family

## 2018-09-07 VITALS — BP 125/77 | HR 69 | Temp 98.9°F | Resp 16 | Ht 63.0 in | Wt 169.0 lb

## 2018-09-07 DIAGNOSIS — R9431 Abnormal electrocardiogram [ECG] [EKG]: Secondary | ICD-10-CM

## 2018-09-07 DIAGNOSIS — Z23 Encounter for immunization: Secondary | ICD-10-CM | POA: Diagnosis not present

## 2018-09-07 DIAGNOSIS — Z0000 Encounter for general adult medical examination without abnormal findings: Secondary | ICD-10-CM | POA: Diagnosis not present

## 2018-09-07 DIAGNOSIS — Z0001 Encounter for general adult medical examination with abnormal findings: Secondary | ICD-10-CM

## 2018-09-07 DIAGNOSIS — R32 Unspecified urinary incontinence: Secondary | ICD-10-CM

## 2018-09-07 MED ORDER — BUPROPION HCL 75 MG PO TABS: ORAL_TABLET | ORAL | 0 refills | Status: DC

## 2018-09-07 NOTE — Patient Instructions (Signed)
Please begin wellbutrin. Complete lab work prior to leaving. Try listening to podcast- Herminio CommonsKatrina Ubelle MD- weight loss for busy physicians. Try to add regular exercise.

## 2018-09-07 NOTE — Progress Notes (Signed)
Subjective:    Patient ID: Alice Thomas, female    DOB: 22-Jan-1971, 47 y.o.   MRN: [id number]  HPI  Patient is a 47 yr old female who presents today for cpx.  Immunizations: flu shot today  Diet:  Reports diet is fair Wt Readings from Last 3 Encounters:  09/07/18 169 lb (76.7 kg)  11/10/17 157 lb (71.2 kg)  05/21/17 163 lb 6.4 oz (74.1 kg)  Exercise: not regularly Colonoscopy:  Will start at 4 Pap Smear: 05/21/17 Mammogram: 07/10/17   Reports no motivation.  Noting excites her anymore.    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  Has an IUD-  Believes that she may be menopausal.  Review of Systems  Constitutional: Positive for unexpected weight change.  HENT: Negative for hearing loss and rhinorrhea.   Eyes: Negative for visual disturbance.  Respiratory: Negative for cough.   Cardiovascular: Negative for leg swelling.  Gastrointestinal: Negative for constipation and diarrhea.  Genitourinary: Negative for dysuria, frequency and hematuria.       Reports leaking urine throughout the day  Musculoskeletal:       Some right sided knee pain- improving.   Skin: Negative for rash.  Neurological: Negative for headaches.  Hematological: Negative for adenopathy.   Past Medical History:  Diagnosis Date  . Allergy    seasonal  . Anemia   . History of chicken pox    childhood  . History of kidney stones   . Thyroid disease    hypothyroidism     Social History   Socioeconomic History  . Marital status: Divorced    Spouse name: Not on file  . Number of children: Not on file  . Years of education: Not on file  . Highest education level: Not on file  Occupational History  . Not on file  Social Needs  . Financial resource strain: Not on file  . Food insecurity:    Worry: Not on file    Inability: Not on file  . Transportation needs:    Medical: Not on file    Non-medical: Not on file  Tobacco Use  . Smoking status:  Current Every Day Smoker    Packs/day: 0.50    Years: 10.00    Pack years: 5.00    Types: Cigarettes  . Smokeless tobacco: Never Used  Substance and Sexual Activity  . Alcohol use: Yes    Alcohol/week: 0.0 - 4.0 standard drinks  . Drug use: No  . Sexual activity: Yes    Partners: Male    Birth control/protection: I.U.D.  Lifestyle  . Physical activity:    Days per week: Not on file    Minutes per session: Not on file  . Stress: Not on file  Relationships  . Social connections:    Talks on phone: Not on file    Gets together: Not on file    Attends religious service: Not on file    Active member of club or organization: Not on file    Attends meetings of clubs or organizations: Not on file    Relationship status: Not on file  . Intimate partner violence:    Fear of current or ex partner: Not on file    Emotionally abused: Not on file    Physically abused: Not on file    Forced sexual activity: Not on file  Other Topics Concern  . 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[employer]  Divorced, married x 7 years   Has boyfriend   No children   No pets   Enjoys volunteering and exercise but has not being doing     Past Surgical History:  Procedure Laterality Date  . ENDOMETRIAL BIOPSY    . HERNIA REPAIR     32 months of age ? abdominal  . INCISION AND DRAINAGE RETROPHYARYNGEAL ABCESS  1992 / 93  . KNEE ARTHROSCOPY WITH ANTERIOR CRUCIATE LIGAMENT (ACL) REPAIR Right 2006  . TONSILLECTOMY AND ADENOIDECTOMY     childhood  . WISDOM TOOTH EXTRACTION  1992 / 108    Family History  Problem Relation Age of Onset  . Crohn's disease Father   . Alzheimer's disease Father   . Parkinson's disease Father     No Known Allergies  Current Outpatient Medications on File Prior to Visit  Medication Sig Dispense Refill  . albuterol (PROVENTIL HFA;VENTOLIN HFA) 108 (90 Base) MCG/ACT inhaler Inhale 2 puffs into the lungs every 6 (six) hours as needed for wheezing or shortness  of breath. 1 Inhaler 2  . azithromycin (ZITHROMAX) 250 MG tablet Take 2 tablets by mouth on day 1, followed by 1 tablet by mouth daily for 4 days. 6 tablet 0  . benzonatate (TESSALON) 100 MG capsule Take 1 capsule (100 mg total) by mouth 3 (three) times daily as needed for cough. 30 capsule 0  . fluticasone (FLONASE) 50 MCG/ACT nasal spray Place 2 sprays into both nostrils daily. 16 g 1  . levothyroxine (SYNTHROID, LEVOTHROID) 150 MCG tablet Take 1 tablet (150 mcg total) by mouth daily. 90 tablet 0  . valACYclovir (VALTREX) 1000 MG tablet 2 g twice daily for 1 day (separate doses by ~12 hours) 10 tablet 5  . varenicline (CHANTIX STARTING MONTH PAK) 0.5 MG X 11 & 1 MG X 42 tablet Take one 0.5 mg tablet by mouth once daily for 3 days, then increase to one 0.5 mg tablet twice daily for 4 days, then increase to one 1 mg tablet twice daily. 53 tablet 0   No current facility-administered medications on file prior to visit.     BP 125/77 (BP Location: Right Arm, Patient Position: Sitting, Cuff Size: Small)   Pulse 69   Temp 98.9 F (37.2 C) (Oral)   Resp 16   Ht 5\' 3"  (1.6 m)   Wt 169 lb (76.7 kg)   SpO2 100%   BMI 29.94 kg/m       Objective:   Physical Exam  Physical Exam  Constitutional: She is oriented to person, place, and time. She appears well-developed and well-nourished. No distress.  HENT:  Head: Normocephalic and atraumatic.  Right Ear: Tympanic membrane and ear canal normal.  Left Ear: Tympanic membrane and ear canal normal.  Mouth/Throat: Oropharynx is clear and moist.  Eyes: Pupils are equal, round, and reactive to light. No scleral icterus.  Neck: Normal range of motion. No thyromegaly present.  Cardiovascular: Normal rate and regular rhythm.   No murmur heard. Pulmonary/Chest: Effort normal and breath sounds normal. No respiratory distress. He has no wheezes. She has no rales. She exhibits no tenderness.  Abdominal: Soft. Bowel sounds are normal. She exhibits no  distension and no mass. There is no tenderness. There is no rebound and no guarding.  Musculoskeletal: She exhibits no edema.  Lymphadenopathy:    She has no cervical adenopathy.  Neurological: She is alert and oriented to person, place, and time. She has normal patellar reflexes. She exhibits normal muscle tone. Coordination  normal.  Skin: Skin is warm and dry.  Psychiatric: She has a normal mood and affect. Her behavior is normal. Judgment and thought content normal.  Breasts: Examined lying Right: Without masses, retractions, discharge or axillary adenopathy.  Left: Without masses, retractions, discharge or axillary adenopathy. Pelvic: deferred         Assessment & Plan:    Preventative care- discussed healthy diet, exercise and weight loss.  Will obtain routine lab work.    Abnormal EKG- EKG is personally reviewed.  Notes NSR, ? R ventricular hypertrophy. Reviewed EKG wit cardiology- recommend 2D echo. Will obtain.  Urinary incontinence-this has only been an issue for about 6 months.  will refer her to GYN. She also has an IUD and wonders if she may be menopausal.  ? If estrace crema would help with her incontinence. Will defer work up to [facility].  Assessment & Plan:

## 2018-09-08 ENCOUNTER — Telehealth: Payer: Self-pay | Admitting: Family

## 2018-09-08 DIAGNOSIS — R9431 Abnormal electrocardiogram [ECG] [EKG]: Secondary | ICD-10-CM

## 2018-09-08 NOTE — Telephone Encounter (Signed)
Please contact pt and let her know that her EKG was mildly abnormal.  I reviewed it with cardiology and they recommended that we have her complete an echocardiogram.  I have pended the echo. Could you please contact pt and sign off?

## 2018-09-18 ENCOUNTER — Ambulatory Visit (HOSPITAL_BASED_OUTPATIENT_CLINIC_OR_DEPARTMENT_OTHER)
Admission: RE | Admit: 2018-09-18 | Discharge: 2018-09-18 | Disposition: A | Discharge status: Home / Self Care | Payer: BLUE CROSS/BLUE SHIELD | Source: Ambulatory Visit | Attending: Family | Admitting: Family

## 2018-09-18 DIAGNOSIS — Z0000 Encounter for general adult medical examination without abnormal findings: Secondary | ICD-10-CM | POA: Insufficient documentation

## 2018-09-18 DIAGNOSIS — Z1231 Encounter for screening mammogram for malignant neoplasm of breast: Secondary | ICD-10-CM | POA: Diagnosis not present

## 2018-09-19 ENCOUNTER — Telehealth: Payer: Self-pay | Admitting: Family

## 2018-09-19 NOTE — Telephone Encounter (Signed)
Reviewed EKG results and plan for echo with patient.  She is agreeable to proceed.

## 2018-09-29 ENCOUNTER — Ambulatory Visit (HOSPITAL_BASED_OUTPATIENT_CLINIC_OR_DEPARTMENT_OTHER)
Admission: RE | Admit: 2018-09-29 | Discharge: 2018-09-29 | Disposition: A | Discharge status: Home / Self Care | Payer: 59 | Source: Ambulatory Visit | Attending: Family | Admitting: Family

## 2018-09-29 DIAGNOSIS — R9431 Abnormal electrocardiogram [ECG] [EKG]: Secondary | ICD-10-CM

## 2018-09-29 NOTE — Progress Notes (Signed)
  Echocardiogram 2D Echocardiogram has been performed.  Alice Thomas T Alice Thomas 09/29/2018, 8:54 AM

## 2018-09-30 ENCOUNTER — Other Ambulatory Visit: Payer: Self-pay | Admitting: Family

## 2018-10-01 ENCOUNTER — Telehealth: Payer: Self-pay | Admitting: Family

## 2018-10-01 ENCOUNTER — Telehealth: Payer: Self-pay

## 2018-10-01 DIAGNOSIS — R931 Abnormal findings on diagnostic imaging of heart and coronary circulation: Secondary | ICD-10-CM

## 2018-10-01 NOTE — Telephone Encounter (Signed)
Please contact pt and let her know that I reviewed her echo. Echo shows strong heart. Note is made of possible slight widening of her aorta.  I would like for her to meet with cardiology for consultation. They may choose to do a CT of her aorta but I will defer to them.

## 2018-10-01 NOTE — Telephone Encounter (Signed)
Lm for patient to call us back about her echo results, phone note with provider's comments created for [facility] triage nurse to see.

## 2018-10-01 NOTE — Telephone Encounter (Signed)
Alice Columbus, RN Just now (10:55 AM)      Pt given results per notes of [name] NP on 10/01/18.Unable to document in result note due to result note not being routed to [facility]. Pt is ok with cardiology referral

## 2018-10-01 NOTE — Telephone Encounter (Signed)
Please contact pt and let her know that I reviewed her echo. Echo shows strong heart. Note is made of possible slight widening of her aorta.  I would like for her to meet with cardiology for consultation. They may choose to do a CT of her aorta but I will defer to them.   Lm for patient to call back for this results, ok for triage nurse to release results and provider's comments to patient.

## 2018-10-01 NOTE — Telephone Encounter (Signed)
Pt given results per notes of [name] NP on 10/01/18.Unable to document in result note due to result note not being routed to [facility]. Pt is ok with cardiology referral

## 2018-10-26 ENCOUNTER — Encounter: Payer: Self-pay | Admitting: Cardiology

## 2018-10-26 ENCOUNTER — Ambulatory Visit (INDEPENDENT_AMBULATORY_CARE_PROVIDER_SITE_OTHER): Payer: 59 | Admitting: Cardiology

## 2018-10-26 VITALS — BP 112/62 | HR 71 | Ht 63.0 in | Wt 165.0 lb

## 2018-10-26 DIAGNOSIS — I77810 Thoracic aortic ectasia: Secondary | ICD-10-CM | POA: Insufficient documentation

## 2018-10-26 DIAGNOSIS — Z01812 Encounter for preprocedural laboratory examination: Secondary | ICD-10-CM

## 2018-10-26 DIAGNOSIS — Z720 Tobacco use: Secondary | ICD-10-CM

## 2018-10-26 LAB — BASIC METABOLIC PANEL
BUN: 17 mg/dL (ref 7–25)
CO2: 27 mmol/L (ref 20–32)
Calcium: 10.3 mg/dL — ABNORMAL HIGH (ref 8.6–10.2)
Chloride: 106 mmol/L (ref 98–110)
Creat: 0.87 mg/dL (ref 0.50–1.10)
Glucose, Bld: 96 mg/dL (ref 65–99)
Potassium: 4.4 mmol/L (ref 3.5–5.3)
Sodium: 141 mmol/L (ref 135–146)

## 2018-10-26 NOTE — Progress Notes (Signed)
Cardiology Office Note:    Date:  10/26/2018   ID:  [name], DOB 01-05-1971, MRN [id number]  PCP:  [name], NP  Cardiologist:  Garwin Brothers, MD   Referring MD: [name], NP    ASSESSMENT:    1. Ascending aorta dilatation (HCC)   2. Tobacco abuse    PLAN:    In order of problems listed above:  1. Primary prevention stressed with the patient.  Importance of compliance with diet and medication stressed and she vocalized understanding.  Her blood pressure is stable.  I reviewed her lipids with her and diet was discussed.  Importance of regular exercise stressed. 2. We will do a CT scan of the chest with contrast to assess her aorta to give her an accurate idea of her aortic anatomy. 3. I spent 5 minutes with the patient discussing solely about smoking. Smoking cessation was counseled. I suggested to the patient also different medications and pharmacological interventions. Patient is keen to try stopping on its own at this time. He will get back to me if he needs any further assistance in this matter. 4. Patient will be seen in follow-up appointment in 6 months or earlier if the patient has any concerns    Medication Adjustments/Labs and Tests Ordered: Current medicines are reviewed at length with the patient today.  Concerns regarding medicines are outlined above.  No orders of the defined types were placed in this encounter.  No orders of the defined types were placed in this encounter.    History of Present Illness:    Alice Thomas is a 48 y.o. female who is being seen today for the evaluation of abnormal echocardiogram for dilated ascending aorta at the request of [name], NP.  Patient is a pleasant 48 year old female with no significant past medical history.  Unfortunately she has been a smoker since a young time.  Her echocardiogram as mentioned above was abnormal and she was sent for an evaluation.  She leads a sedentary lifestyle  and does not exercise on a regular basis.  No chest pain orthopnea or PND.  At the time of my evaluation, the patient is alert awake oriented and in no distress.  Past Medical History:  Diagnosis Date  . Allergy    seasonal  . Anemia   . History of chicken pox    childhood  . History of kidney stones   . Thyroid disease    hypothyroidism    Past Surgical History:  Procedure Laterality Date  . ENDOMETRIAL BIOPSY    . HERNIA REPAIR     67 months of age ? abdominal  . INCISION AND DRAINAGE RETROPHYARYNGEAL ABCESS  1992 / 93  . KNEE ARTHROSCOPY WITH ANTERIOR CRUCIATE LIGAMENT (ACL) REPAIR Right 2006  . TONSILLECTOMY AND ADENOIDECTOMY     childhood  . WISDOM TOOTH EXTRACTION  1992 / 93    Current Medications: Current Meds  Medication Sig  . albuterol (PROVENTIL HFA;VENTOLIN HFA) 108 (90 Base) MCG/ACT inhaler Inhale 2 puffs into the lungs every 6 (six) hours as needed for wheezing or shortness of breath.  Marland Kitchen buPROPion (WELLBUTRIN) 75 MG tablet Take 1 tab by mouth once daily for 3 days, then increase to twice daily  . levothyroxine (SYNTHROID, LEVOTHROID) 150 MCG tablet Take 1 tablet (150 mcg total) by mouth daily.  . valACYclovir (VALTREX) 1000 MG tablet 2 g twice daily for 1 day (separate doses by ~12 hours)     Allergies:   Patient has no  known allergies.   Social History   Socioeconomic History  . Marital status: Divorced    Spouse name: Not on file  . Number of children: Not on file  . Years of education: Not on file  . Highest education level: Not on file  Occupational History  . Not on file  Social Needs  . Financial resource strain: Not on file  . Food insecurity:    Worry: Not on file    Inability: Not on file  . Transportation needs:    Medical: Not on file    Non-medical: Not on file  Tobacco Use  . Smoking status: Current Every Day Smoker    Packs/day: 0.50    Years: 10.00    Pack years: 5.00    Types: Cigarettes  . Smokeless tobacco: Never Used    Substance and Sexual Activity  . Alcohol use: Yes    Alcohol/week: 0.0 - 4.0 standard drinks  . Drug use: No  . Sexual activity: Yes    Partners: Male    Birth control/protection: I.U.D.  Lifestyle  . Physical activity:    Days per week: Not on file    Minutes per session: Not on file  . Stress: Not on file  Relationships  . Social connections:    Talks on phone: Not on file    Gets together: Not on file    Attends religious service: Not on file    Active member of club or organization: Not on file    Attends meetings of clubs or organizations: Not on file    Relationship status: Not on file  Other Topics Concern  . 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[employer], married x 7 years   Has boyfriend   No children   No pets   Enjoys volunteering and exercise but has not being doing      Family History: The patient's family history includes Alzheimer's disease in her father; Crohn's disease in her father; Parkinson's disease in her father.  ROS:   Please see the history of present illness.    All other systems reviewed and are negative.  EKGs/Labs/Other Studies Reviewed:    The following studies were reviewed today: I discussed the findings of the echocardiogram.  The ascending aorta is measured at 3.5 cm.   Recent Labs: 08/25/2018: TSH 32.67  Recent Lipid Panel    Component Value Date/Time   CHOL 202 (H) 05/21/2017 1011   TRIG 73 05/21/2017 1011   HDL 70 05/21/2017 1011   CHOLHDL 2.9 05/21/2017 1011   VLDL 15 05/21/2017 1011   LDLCALC 117 (H) 05/21/2017 1011    Physical Exam:    VS:  BP 112/62 (BP Location: Right Arm, Patient Position: Sitting, Cuff Size: Normal)   Pulse 71   Ht 5\' 3"  (1.6 m)   Wt 165 lb (74.8 kg)   SpO2 98%   BMI 29.23 kg/m     Wt Readings from Last 3 Encounters:  10/26/18 165 lb (74.8 kg)  09/07/18 169 lb (76.7 kg)  11/10/17 157 lb (71.2 kg)     GEN: Patient is in no acute distress HEENT: Normal NECK: No  JVD; No carotid bruits LYMPHATICS: No lymphadenopathy CARDIAC: S1 S2 regular, 2/6 systolic murmur at the apex. RESPIRATORY:  Clear to auscultation without rales, wheezing or rhonchi  ABDOMEN: Soft, non-tender, non-distended MUSCULOSKELETAL:  No edema; No deformity  SKIN: Warm and dry NEUROLOGIC:  Alert and oriented x 3 PSYCHIATRIC:  Normal affect    Signed, Garwin Brothersajan R Braedan Meuth, MD  10/26/2018 9:47 AM    [facility]

## 2018-10-26 NOTE — Patient Instructions (Signed)
Medication Instructions:  Your physician recommends that you continue on your current medications as directed. Please refer to the Current Medication list given to you today.  If you need a refill on your cardiac medications before your next appointment, please call your pharmacy.   Lab work: Your physician recommends that you return for lab work today: BMP.   If you have labs (blood work) drawn today and your tests are completely normal, you will receive your results only by: Marland Kitchen MyChart Message (if you have MyChart) OR . A paper copy in the mail If you have any lab test that is abnormal or we need to change your treatment, we will call you to review the results.  Testing/Procedures: Non-Cardiac CT Angiography (CTA), is a special type of CT scan that uses a computer to produce multi-dimensional views of major blood vessels throughout the body. In CT angiography, a contrast material is injected through an IV to help visualize the blood vessels.  Follow-Up: At [facility], you and your health needs are our priority.  As part of our continuing mission to provide you with exceptional heart care, we have created designated Provider Care Teams.  These Care Teams include your primary Cardiologist (physician) and Advanced Practice Providers (APPs -  Physician Assistants and Nurse Practitioners) who all work together to provide you with the care you need, when you need it. You will need a follow up appointment in 6 months.  Please call our office 2 months in advance to schedule this appointment.

## 2018-10-29 ENCOUNTER — Ambulatory Visit (INDEPENDENT_AMBULATORY_CARE_PROVIDER_SITE_OTHER): Payer: BLUE CROSS/BLUE SHIELD | Admitting: Family Medicine

## 2018-10-29 ENCOUNTER — Telehealth: Payer: Self-pay | Admitting: Family

## 2018-10-29 ENCOUNTER — Encounter: Payer: Self-pay | Admitting: Family Medicine

## 2018-10-29 VITALS — BP 109/76 | HR 74 | Ht 63.0 in | Wt 163.0 lb

## 2018-10-29 DIAGNOSIS — N951 Menopausal and female climacteric states: Secondary | ICD-10-CM | POA: Diagnosis not present

## 2018-10-29 DIAGNOSIS — R32 Unspecified urinary incontinence: Secondary | ICD-10-CM | POA: Diagnosis not present

## 2018-10-29 LAB — POCT URINALYSIS DIPSTICK
Blood, UA: NEGATIVE
Glucose, UA: NEGATIVE
Leukocytes, UA: NEGATIVE
PH UA: 7 (ref 5.0–8.0)
Protein, UA: NEGATIVE
Spec Grav, UA: 1.01 (ref 1.010–1.025)

## 2018-10-29 MED ORDER — OXYBUTYNIN CHLORIDE ER 5 MG PO TB24: 5.0000 mg | ORAL_TABLET | Freq: Every day | ORAL | 3 refills | Status: DC

## 2018-10-29 NOTE — Progress Notes (Signed)
Subjective:    Patient ID: Alice Thomas, female    DOB: 1971/06/10, 48 y.o.   MRN: [id number]  HPI 48 yo G0 who presents with several months of incontinence that occurs several times every day. Doesn't feel that bladder is full during those times. No incontinence with laughing, sneezing, or coughing.  No strong smelling urine, dysuria.  She wears a pad constantly in order to prevent wetting close.  Denies vaginal trauma, cysts, infections.  Patient also in for hot flashes.  She has been getting hot flashes for several months now which initially was 2-3 times per night, but has now improved to 1 time a night.  She has been trying primrose oil, which she is found to be helpful.  She has a Mirena IUD that was placed approximately 5 years ago and has not had a period since the placement of the IUD.  I have reviewed the patients past medical, family, and social history.  I have reviewed the patient's medication list and allergies.   Review of Systems  All other systems reviewed and are negative.      Objective:   Physical Exam Exam conducted with a chaperone present.  Constitutional:      Appearance: Normal appearance.  HENT:     Head: Normocephalic and atraumatic.  Cardiovascular:     Rate and Rhythm: Normal rate.  Pulmonary:     Effort: Pulmonary effort is normal.  Abdominal:     General: Abdomen is flat. There is no distension.     Palpations: Abdomen is soft. There is no mass.     Tenderness: There is no abdominal tenderness.     Hernia: There is no hernia in the right inguinal area or left inguinal area.  Genitourinary:    Labia:        Right: No rash, tenderness, lesion or injury.        Left: No rash, tenderness, lesion or injury.      Vagina: No signs of injury and foreign body. No vaginal discharge, erythema, tenderness, bleeding, lesions or prolapsed vaginal walls.     Cervix: No cervical motion tenderness, discharge, friability, lesion, erythema, cervical bleeding or  eversion.     Uterus: Not deviated, not enlarged, not fixed, not tender and no uterine prolapse.      Adnexa:        Right: No mass, tenderness or fullness.         Left: No mass, tenderness or fullness.    Lymphadenopathy:     Lower Body: No right inguinal adenopathy. No left inguinal adenopathy.  Neurological:     Mental Status: She is alert.  Psychiatric:        Mood and Affect: Mood normal.        Behavior: Behavior normal.        Thought Content: Thought content normal.        Judgment: Judgment normal.       Assessment & Plan:  1. Urinary incontinence, unspecified type Urge versus overflow incontinence.  No evidence of fistula on exam.  Will trial Ditropan.  Follow-up in 2 to 3 months for reevaluation - POCT urinalysis dipstick  2. Menopausal symptoms The patient is at least perimenopausal.  We will check follicle-stimulating hormone.  Even though the IUD is due to come out in the next few months, I discussed with the patient that it is likely okay for the next 2 years.  Patient amenable to leaving this in at the current time.   -  Follicle stimulating hormone  30 minute spent with the patient, including exam, discussing symptoms, reviewing chart.

## 2018-10-29 NOTE — Telephone Encounter (Signed)
Patient came into the office requesting [facility] because of insurance purposes. [facility] so she can get the labs drawn there.

## 2018-10-30 NOTE — Telephone Encounter (Signed)
Spoke with patient.  Lab reqs faxed to her so she can have labs done at drawing station.

## 2018-10-30 NOTE — Telephone Encounter (Signed)
Lm for patient to call me back about this orders.

## 2018-10-31 ENCOUNTER — Other Ambulatory Visit: Payer: Self-pay | Admitting: Family

## 2018-11-13 ENCOUNTER — Other Ambulatory Visit: Payer: Self-pay | Admitting: Family

## 2018-11-14 LAB — FOLLICLE STIMULATING HORMONE: FSH: 115.3 m[IU]/mL

## 2018-11-16 ENCOUNTER — Ambulatory Visit (HOSPITAL_BASED_OUTPATIENT_CLINIC_OR_DEPARTMENT_OTHER)
Admission: RE | Admit: 2018-11-16 | Discharge: 2018-11-16 | Disposition: A | Discharge status: Home / Self Care | Payer: 59 | Source: Ambulatory Visit | Attending: Cardiology | Admitting: Cardiology

## 2018-11-16 ENCOUNTER — Encounter (HOSPITAL_BASED_OUTPATIENT_CLINIC_OR_DEPARTMENT_OTHER): Payer: Self-pay

## 2018-11-16 DIAGNOSIS — I77810 Thoracic aortic ectasia: Secondary | ICD-10-CM | POA: Insufficient documentation

## 2018-11-16 MED ORDER — IOPAMIDOL (ISOVUE-370) INJECTION 76%
100.0000 mL | Freq: Once | INTRAVENOUS | Status: AC | PRN
  Administered 2018-11-16: 100 mL via INTRAVENOUS

## 2018-11-16 MED ORDER — IOPAMIDOL (ISOVUE-300) INJECTION 61%: 100.0000 mL | Freq: Once | INTRAVENOUS | Status: DC | PRN

## 2018-11-17 ENCOUNTER — Telehealth: Payer: Self-pay | Admitting: Emergency Medicine

## 2018-11-17 ENCOUNTER — Other Ambulatory Visit: Payer: Self-pay

## 2018-11-17 ENCOUNTER — Encounter: Payer: Self-pay | Admitting: Family

## 2018-11-17 ENCOUNTER — Ambulatory Visit: Payer: Self-pay | Admitting: Family

## 2018-11-17 DIAGNOSIS — E875 Hyperkalemia: Secondary | ICD-10-CM

## 2018-11-17 DIAGNOSIS — N6320 Unspecified lump in the left breast, unspecified quadrant: Secondary | ICD-10-CM

## 2018-11-17 NOTE — Telephone Encounter (Signed)
Haily Dr. Kem Parkinson office called in to make sure Alice Craze, FNP was aware they also found a nodule in the left breast.      The pt was made aware of this finding. I spoke with Windell Moulding at Alice Craze, FNP, office.   I made her aware of the findings regarding the nodule in the left breast.

## 2018-11-17 NOTE — Telephone Encounter (Signed)
Spoke to patient.  Reviewed results of CT scan performed with cardiology, specifically left breast mass noted on CT.  Although she had a negative mammogram performed in the end of December I think it would be prudent for [place] to perform a diagnostic mammogram of the left breast as well as an ultrasound for further evaluation.  Patient is agreeable.  We also discussed her finding on lab work of elevated cholesterol.  Discussed importance of healthy diet and exercise.  I did note that her calcium was mildly elevated.  Ruth-please contact her lab, see details below.  Ask if they can add on a TSH diagnosis hypothyroid, and a PTH intact diagnosis hypercalcemia?  Please let me know if they are unable to add these tests.

## 2018-11-17 NOTE — Telephone Encounter (Signed)
[facility] today to [facility] on TSH.  See mychart message.

## 2018-11-17 NOTE — Telephone Encounter (Signed)
Patient informed of ct chest aorta results and the breast nodule. Faxed results to pcp. Contacted patient's pcp office and informed Arline Asp, RN of these results she will make sure [name], NP patient's pcp is informed of this and they will follow up with the patient. Patient was also informed to contact their office this afternoon.

## 2018-11-17 NOTE — Telephone Encounter (Signed)
Called lab to add test, only able to add TSH. Talked to patient and she asked for PTH intact order be faxed to her so she can get it done today. Order faxed to patient at number provided by her.

## 2018-11-18 ENCOUNTER — Encounter: Payer: Self-pay | Admitting: Family

## 2018-11-18 LAB — URINALYSIS, ROUTINE W REFLEX MICROSCOPIC
Bacteria, UA: NONE SEEN /HPF
Bilirubin Urine: NEGATIVE
Glucose, UA: NEGATIVE
HGB URINE DIPSTICK: NEGATIVE
Hyaline Cast: NONE SEEN /LPF
Ketones, ur: NEGATIVE
Nitrite: NEGATIVE
PROTEIN: NEGATIVE
RBC / HPF: NONE SEEN /HPF (ref 0–2)
Specific Gravity, Urine: 1.018 (ref 1.001–1.03)
WBC, UA: NONE SEEN /HPF (ref 0–5)
pH: 8 (ref 5.0–8.0)

## 2018-11-18 LAB — BASIC METABOLIC PANEL WITH GFR
BUN: 16 mg/dL (ref 7–25)
CO2: 31 mmol/L (ref 20–32)
Calcium: 10.5 mg/dL — ABNORMAL HIGH (ref 8.6–10.2)
Chloride: 103 mmol/L (ref 98–110)
Creat: 0.94 mg/dL (ref 0.50–1.10)
GFR, Est African American: 83 mL/min/{1.73_m2} (ref >=60)
GFR, Est Non African American: 72 mL/min/{1.73_m2} (ref >=60)
GLUCOSE: 89 mg/dL (ref 65–99)
Potassium: 4.8 mmol/L (ref 3.5–5.3)
Sodium: 139 mmol/L (ref 135–146)

## 2018-11-18 LAB — TEST AUTHORIZATION

## 2018-11-18 LAB — HEPATIC FUNCTION PANEL
AG Ratio: 2.2 (calc) (ref 1.0–2.5)
ALT: 10 U/L (ref 6–29)
AST: 11 U/L (ref 10–35)
Albumin: 4.6 g/dL (ref 3.6–5.1)
Alkaline phosphatase (APISO): 52 U/L (ref 31–125)
Bilirubin, Direct: 0.1 mg/dL (ref 0.0–0.2)
Globulin: 2.1 g/dL (calc) (ref 1.9–3.7)
Indirect Bilirubin: 0.3 mg/dL (calc) (ref 0.2–1.2)
TOTAL PROTEIN: 6.7 g/dL (ref 6.1–8.1)
Total Bilirubin: 0.4 mg/dL (ref 0.2–1.2)

## 2018-11-18 LAB — LIPID PANEL
Cholesterol: 244 mg/dL — ABNORMAL HIGH (ref <200)
HDL: 66 mg/dL (ref >=50)
LDL Cholesterol (Calc): 161 mg/dL (calc) — ABNORMAL HIGH
Non-HDL Cholesterol (Calc): 178 mg/dL (calc) — ABNORMAL HIGH (ref <130)
Total CHOL/HDL Ratio: 3.7 (calc) (ref <5.0)
Triglycerides: 71 mg/dL (ref <150)

## 2018-11-18 LAB — CBC WITH DIFFERENTIAL/PLATELET
Absolute Monocytes: 422 cells/uL (ref 200–950)
Basophils Absolute: 31 cells/uL (ref 0–200)
Basophils Relative: 0.7 %
EOS PCT: 1.6 %
Eosinophils Absolute: 70 cells/uL (ref 15–500)
HCT: 43.8 % (ref 35.0–45.0)
Hemoglobin: 14.9 g/dL (ref 11.7–15.5)
Lymphs Abs: 1593 cells/uL (ref 850–3900)
MCH: 30.5 pg (ref 27.0–33.0)
MCHC: 34 g/dL (ref 32.0–36.0)
MCV: 89.8 fL (ref 80.0–100.0)
MONOS PCT: 9.6 %
MPV: 10 fL (ref 7.5–12.5)
Neutro Abs: 2284 cells/uL (ref 1500–7800)
Neutrophils Relative %: 51.9 %
Platelets: 250 10*3/uL (ref 140–400)
RBC: 4.88 10*6/uL (ref 3.80–5.10)
RDW: 12.2 % (ref 11.0–15.0)
Total Lymphocyte: 36.2 %
WBC: 4.4 10*3/uL (ref 3.8–10.8)

## 2018-11-18 LAB — TSH: TSH: 1.62 mIU/L

## 2018-11-24 ENCOUNTER — Ambulatory Visit: Payer: 59

## 2018-11-24 ENCOUNTER — Ambulatory Visit
Admission: RE | Admit: 2018-11-24 | Discharge: 2018-11-24 | Disposition: A | Discharge status: Home / Self Care | Payer: 59 | Source: Ambulatory Visit | Attending: Family | Admitting: Family

## 2018-11-24 DIAGNOSIS — N6320 Unspecified lump in the left breast, unspecified quadrant: Secondary | ICD-10-CM

## 2018-11-25 LAB — PTH, INTACT AND CALCIUM
Calcium: 10.3 mg/dL — ABNORMAL HIGH (ref 8.6–10.2)
PTH: 53 pg/mL (ref 14–64)

## 2018-11-26 ENCOUNTER — Encounter: Payer: Self-pay | Admitting: Family

## 2018-11-30 ENCOUNTER — Other Ambulatory Visit: Payer: Self-pay | Admitting: Family

## 2018-12-09 ENCOUNTER — Other Ambulatory Visit: Payer: Self-pay

## 2018-12-09 DIAGNOSIS — E039 Hypothyroidism, unspecified: Secondary | ICD-10-CM

## 2018-12-09 MED ORDER — LEVOTHYROXINE SODIUM 150 MCG PO TABS: 150.0000 ug | ORAL_TABLET | Freq: Every day | ORAL | 0 refills | Status: DC

## 2018-12-27 ENCOUNTER — Other Ambulatory Visit: Payer: Self-pay | Admitting: Family

## 2019-01-30 ENCOUNTER — Other Ambulatory Visit: Payer: Self-pay | Admitting: Family Medicine

## 2019-03-03 ENCOUNTER — Other Ambulatory Visit: Payer: Self-pay | Admitting: Family

## 2019-03-03 DIAGNOSIS — E039 Hypothyroidism, unspecified: Secondary | ICD-10-CM

## 2019-05-27 ENCOUNTER — Other Ambulatory Visit: Payer: Self-pay | Admitting: Family

## 2019-05-27 DIAGNOSIS — E039 Hypothyroidism, unspecified: Secondary | ICD-10-CM

## 2019-06-25 ENCOUNTER — Ambulatory Visit: Payer: Self-pay

## 2019-06-25 ENCOUNTER — Encounter: Payer: Self-pay | Admitting: Family Medicine

## 2019-06-25 ENCOUNTER — Ambulatory Visit (INDEPENDENT_AMBULATORY_CARE_PROVIDER_SITE_OTHER): Payer: Managed Care, Other (non HMO) | Admitting: Family Medicine

## 2019-06-25 ENCOUNTER — Other Ambulatory Visit: Payer: Self-pay

## 2019-06-25 VITALS — BP 129/91 | HR 76 | Ht 64.0 in | Wt 165.0 lb

## 2019-06-25 DIAGNOSIS — M25561 Pain in right knee: Secondary | ICD-10-CM

## 2019-06-25 DIAGNOSIS — G8929 Other chronic pain: Secondary | ICD-10-CM | POA: Insufficient documentation

## 2019-06-25 DIAGNOSIS — M23203 Derangement of unspecified medial meniscus due to old tear or injury, right knee: Secondary | ICD-10-CM | POA: Insufficient documentation

## 2019-06-25 MED ORDER — IBUPROFEN-FAMOTIDINE 800-26.6 MG PO TABS: 1.0000 | ORAL_TABLET | Freq: Three times a day (TID) | ORAL | 3 refills | Status: DC

## 2019-06-25 MED ORDER — PENNSAID 2 % TD SOLN: 1.0000 "application " | Freq: Two times a day (BID) | TRANSDERMAL | 3 refills | Status: DC
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
## 2019-06-25 NOTE — Patient Instructions (Signed)
Nice to meet you Please try ice and compression or the knee brace  Please try the exercises  You can try the duexis for 3 days straight and then as needed  Please try the rub on medicine after that   Please send me a message in [place] with any questions or updates.  Please see me back in 4 weeks.   --Dr. Raeford Razor

## 2019-06-25 NOTE — Assessment & Plan Note (Signed)
History of ACL tear with surgery several years ago.  Degenerative changes noted of the meniscus which is likely contributing to her ongoing pain.  No effusion today. -Duexis and provided sample. -Pennsaid. -Counseled on home exercise therapy and supportive care. -Discussed and counseled on bracing. -If no improvement consider physical therapy or injection or imaging

## 2019-06-29 ENCOUNTER — Other Ambulatory Visit: Payer: Self-pay

## 2019-06-30 ENCOUNTER — Encounter: Payer: Self-pay | Admitting: Family

## 2019-06-30 ENCOUNTER — Ambulatory Visit (INDEPENDENT_AMBULATORY_CARE_PROVIDER_SITE_OTHER): Payer: Managed Care, Other (non HMO) | Admitting: Family

## 2019-06-30 VITALS — BP 116/80 | HR 67 | Temp 97.4°F | Resp 16 | Ht 63.5 in | Wt 169.0 lb

## 2019-06-30 DIAGNOSIS — E039 Hypothyroidism, unspecified: Secondary | ICD-10-CM

## 2019-06-30 DIAGNOSIS — Z720 Tobacco use: Secondary | ICD-10-CM | POA: Diagnosis not present

## 2019-06-30 DIAGNOSIS — Z23 Encounter for immunization: Secondary | ICD-10-CM

## 2019-06-30 DIAGNOSIS — G8929 Other chronic pain: Secondary | ICD-10-CM | POA: Diagnosis not present

## 2019-06-30 DIAGNOSIS — M25561 Pain in right knee: Secondary | ICD-10-CM

## 2019-06-30 MED ORDER — BUPROPION HCL ER (SR) 100 MG PO TB12: ORAL_TABLET | ORAL | 1 refills | Status: DC

## 2019-06-30 MED ORDER — LEVOTHYROXINE SODIUM 150 MCG PO TABS: 150.0000 ug | ORAL_TABLET | Freq: Every day | ORAL | 1 refills | Status: DC

## 2019-06-30 NOTE — Patient Instructions (Signed)
Please complete lab work prior to leaving. Start wellbutrin to help you quit smoking.

## 2019-06-30 NOTE — Progress Notes (Signed)
Subjective:    Patient ID: Alice Thomas, female    DOB: 1971-02-22, 48 y.o.   MRN: [id number]  HPI  Patient is a 48 yr old female who presents today for follow up of her hypothyroidism.  Reports feeling well on current dose of synthroid. She reports that she missed a few days recently.   Lab Results  Component Value Date   TSH 1.62 11/13/2018   Wt Readings from Last 3 Encounters:  06/30/19 169 lb (76.7 kg)  06/25/19 165 lb (74.8 kg)  10/29/18 163 lb 0.6 oz (74 kg)   Saw sports med for knee pain- reports that knee is feeling some better with nsaid.   Tobacco abuse- reports that wellbutrin did not help.  Overall mood is OK.  Has been overeating though.   Review of Systems Past Medical History:  Diagnosis Date  . Allergy    seasonal  . Anemia   . History of chicken pox    childhood  . History of kidney stones   . Thyroid disease    hypothyroidism     Social History   Socioeconomic History  . Marital status: Divorced    Spouse name: Not on file  . Number of children: Not on file  . Years of education: Not on file  . Highest education level: Not on file  Occupational History  . Not on file  Social Needs  . Financial resource strain: Not on file  . Food insecurity    Worry: Not on file    Inability: Not on file  . Transportation needs    Medical: Not on file    Non-medical: Not on file  Tobacco Use  . Smoking status: Current Every Day Smoker    Packs/day: 0.50    Years: 10.00    Pack years: 5.00    Types: Cigarettes  . Smokeless tobacco: Never Used  Substance and Sexual Activity  . Alcohol use: Yes    Alcohol/week: 0.0 - 4.0 standard drinks  . Drug use: No  . Sexual activity: Yes    Partners: Male    Birth control/protection: I.U.D.  Lifestyle  . Physical activity    Days per week: Not on file    Minutes per session: Not on file  . Stress: Not on file  Relationships  . Social Musician on phone: Not on file    Gets together: Not on  file    Attends religious service: Not on file    Active member of club or organization: Not on file    Attends meetings of clubs or organizations: Not on file    Relationship status: Not on file  . Intimate partner violence    Fear of current or ex partner: Not on file    Emotionally abused: Not on file    Physically abused: Not on file    Forced sexual activity: Not on file  Other Topics Concern  . 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[employer], married x 7 years   Has boyfriend   No children   No pets   Enjoys volunteering and exercise but has not being doing     Past Surgical History:  Procedure Laterality Date  . ENDOMETRIAL BIOPSY    . HERNIA REPAIR     80 months of age ? abdominal  . INCISION AND DRAINAGE RETROPHYARYNGEAL ABCESS  1992 / 93  . KNEE ARTHROSCOPY WITH ANTERIOR CRUCIATE LIGAMENT (ACL) REPAIR  Right 2006  . TONSILLECTOMY AND ADENOIDECTOMY     childhood  . WISDOM TOOTH EXTRACTION  1992 / 41    Family History  Problem Relation Age of Onset  . Crohn's disease Father   . Alzheimer's disease Father   . Parkinson's disease Father     No Known Allergies  Current Outpatient Medications on File Prior to Visit  Medication Sig Dispense Refill  . albuterol (PROVENTIL HFA;VENTOLIN HFA) 108 (90 Base) MCG/ACT inhaler Inhale 2 puffs into the lungs every 6 (six) hours as needed for wheezing or shortness of breath. 1 Inhaler 2  . Diclofenac Sodium (PENNSAID) 2 % SOLN Place 1 application onto the skin 2 (two) times daily. 112 g 3  . Ibuprofen-Famotidine 800-26.6 MG TABS Take 1 tablet by mouth 3 (three) times daily. 90 tablet 3  . levothyroxine (SYNTHROID) 150 MCG tablet TAKE 1 TABLET BY MOUTH EVERY DAY 90 tablet 0  . oxybutynin (DITROPAN-XL) 5 MG 24 hr tablet TAKE 1 TABLET BY MOUTH EVERYDAY AT BEDTIME 90 tablet 1  . valACYclovir (VALTREX) 1000 MG tablet 2 g twice daily for 1 day (separate doses by ~12 hours) 10 tablet 5  . buPROPion (WELLBUTRIN) 75  MG tablet TAKE 1 TAB BY MOUTH ONCE DAILY FOR 3 DAYS, THEN INCREASE TO TWICE DAILY (Patient not taking: Reported on 06/30/2019) 180 tablet 1   No current facility-administered medications on file prior to visit.     BP 116/80 (BP Location: Right Arm, Patient Position: Sitting, Cuff Size: Small)   Pulse 67   Temp (!) 97.4 F (36.3 C) (Temporal)   Resp 16   Ht 5' 3.5" (1.613 m)   Wt 169 lb (76.7 kg)   SpO2 100%   BMI 29.47 kg/m       Objective:   Physical Exam Constitutional:      Appearance: She is well-developed.  Neck:     Musculoskeletal: Neck supple.     Thyroid: No thyroid mass or thyromegaly.  Cardiovascular:     Rate and Rhythm: Normal rate and regular rhythm.     Heart sounds: Normal heart sounds. No murmur.  Pulmonary:     Effort: Pulmonary effort is normal. No respiratory distress.     Breath sounds: Normal breath sounds. No wheezing.  Lymphadenopathy:     Cervical: No cervical adenopathy.  Skin:    General: Skin is warm and dry.  Neurological:     Mental Status: She is alert and oriented to person, place, and time.  Psychiatric:        Behavior: Behavior normal.        Thought Content: Thought content normal.        Judgment: Judgment normal.           Assessment & Plan:  Hypothyroid- obtain follow up TSH. Continue synthroid.  Tobacco abuse- will restart wellbutrin at zyban dosing. She was previously on 75mg  bid for depression symptoms. She did score 6 today on PHQ-9.  R knee pain- improving with NSAID- defer management to sports med.   Flu shot today

## 2019-07-01 LAB — TSH: TSH: 35.85 mIU/L — ABNORMAL HIGH

## 2019-07-02 ENCOUNTER — Telehealth: Payer: Self-pay | Admitting: Family

## 2019-07-02 MED ORDER — LEVOTHYROXINE SODIUM 175 MCG PO TABS: 175.0000 ug | ORAL_TABLET | Freq: Every day | ORAL | 3 refills | Status: DC

## 2019-07-02 NOTE — Telephone Encounter (Signed)
Left detailed message for patient to call me back about this information.

## 2019-07-02 NOTE — Telephone Encounter (Signed)
I would like her to increase her synthroid from 150 mcg to 175 mcg. Repeat TSH in 6 weeks.

## 2019-07-05 ENCOUNTER — Other Ambulatory Visit: Payer: Self-pay

## 2019-07-05 DIAGNOSIS — E039 Hypothyroidism, unspecified: Secondary | ICD-10-CM

## 2019-07-05 MED ORDER — LEVOTHYROXINE SODIUM 175 MCG PO TABS: 175.0000 ug | ORAL_TABLET | Freq: Every day | ORAL | 3 refills | Status: DC

## 2019-07-05 NOTE — Telephone Encounter (Signed)
Patient has been advised of dosage change, she will call back to set up lab appointment. Orders entered.

## 2019-07-07 NOTE — Progress Notes (Signed)
Talked to patient yesterday and advised of new dosage. And to repeat tsh in one month.

## 2019-07-22 ENCOUNTER — Ambulatory Visit: Payer: Managed Care, Other (non HMO) | Admitting: Family Medicine

## 2019-07-27 ENCOUNTER — Other Ambulatory Visit: Payer: Self-pay | Admitting: Family

## 2019-08-27 ENCOUNTER — Other Ambulatory Visit: Payer: Self-pay | Admitting: Family

## 2019-10-27 ENCOUNTER — Encounter: Payer: Self-pay | Admitting: Family

## 2019-10-27 DIAGNOSIS — E039 Hypothyroidism, unspecified: Secondary | ICD-10-CM

## 2019-10-28 ENCOUNTER — Telehealth: Payer: Self-pay | Admitting: Family

## 2019-10-28 NOTE — Telephone Encounter (Signed)
Alice Thomas, can you please contact pt to request draw site info and fax requisition? thanks

## 2019-10-28 NOTE — Telephone Encounter (Signed)
-----   Message from Kathi Simpers, CMA sent at 10/28/2019  8:55 AM EST ----- We should probably fax a copy of the requisition to the draw center she is going to. For whatever reason, I have been told that the [facility] centers cannot see the orders we place so the pt either needs to pick the order up and take it with her or we can fax to the location she is going to. ----- Message ----- From: [name], NP Sent: 10/28/2019   8:35 AM EST To: Kathi Simpers, CMA, Soyla Dryer Price  Good Morning,  This pt wants to go to [facility] to have her TSH drawn. I put a future order, lab collect, and entered [facility]. Is there anything else we need to do for her?  Thanks,  [employer]

## 2019-10-28 NOTE — Telephone Encounter (Signed)
Left detailed message for patient to call back with a specific [facility] location so we can fax order directly [facility].

## 2019-10-29 ENCOUNTER — Other Ambulatory Visit: Payer: Self-pay | Admitting: Family

## 2019-10-29 NOTE — Telephone Encounter (Signed)
Patient called and will like order to be faxed [facility] in [place] fax # [phone] and a copy will be mailed out to her at her request.

## 2019-11-23 ENCOUNTER — Other Ambulatory Visit: Payer: Self-pay | Admitting: Family

## 2020-01-18 ENCOUNTER — Ambulatory Visit (INDEPENDENT_AMBULATORY_CARE_PROVIDER_SITE_OTHER): Payer: 59 | Admitting: Family

## 2020-01-18 DIAGNOSIS — R41840 Attention and concentration deficit: Secondary | ICD-10-CM

## 2020-01-18 NOTE — Progress Notes (Signed)
Virtual Visit via Video Note  I connected with Alice Thomas on 01/18/20 at  6:00 PM EDT by a video enabled telemedicine application and verified that I am speaking with the correct person using two identifiers.  Location: Patient: home Provider: work   I discussed the limitations of evaluation and management by telemedicine and the availability of in person appointments. The patient expressed understanding and agreed to proceed.  History of Present Illness:  Reports that she is struggling at work, unable to multitask.  Can't complete anything on time.  Thinks that this may have been an unaddressed issue as a child as well.    Observations/Objective:   Gen: Awake, alert, no acute distress Resp: Breathing is even and non-labored Psych: calm/pleasant demeanor Neuro: Alert and Oriented x 3, + facial symmetry, speech is clear.   Assessment and Plan:  Attention deficit- Will refer to psychology for formal ADHD evaluation. If positive work up plan to treat.    Follow Up Instructions:    I discussed the assessment and treatment plan with the patient. The patient was provided an opportunity to ask questions and all were answered. The patient agreed with the plan and demonstrated an understanding of the instructions.   The patient was advised to call back or seek an in-person evaluation if the symptoms worsen or if the condition fails to improve as anticipated.  Lemont Fillers, NP

## 2020-01-21 ENCOUNTER — Ambulatory Visit: Payer: Managed Care, Other (non HMO) | Admitting: Family

## 2020-02-17 IMAGING — MG DIGITAL SCREENING BILATERAL MAMMOGRAM WITH TOMO AND CAD
8 series · 8 of 24 positions shown · non-contrast
Comparison: Previous exam(s).

CLINICAL DATA: Screening.

EXAM:
DIGITAL SCREENING BILATERAL MAMMOGRAM WITH TOMO AND CAD

[R CC synth-2D]
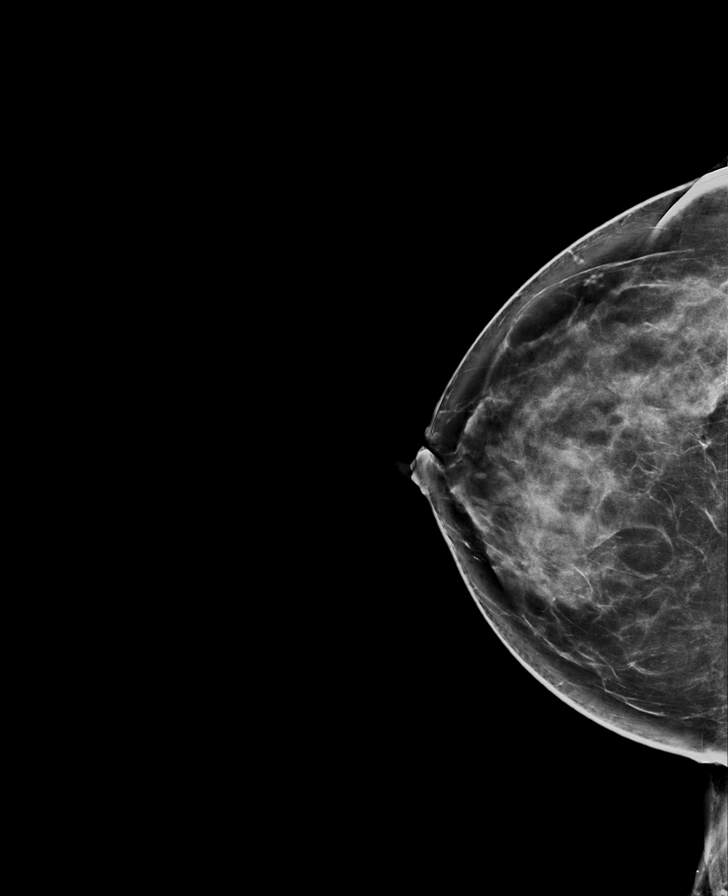

[R MLO synth-2D]
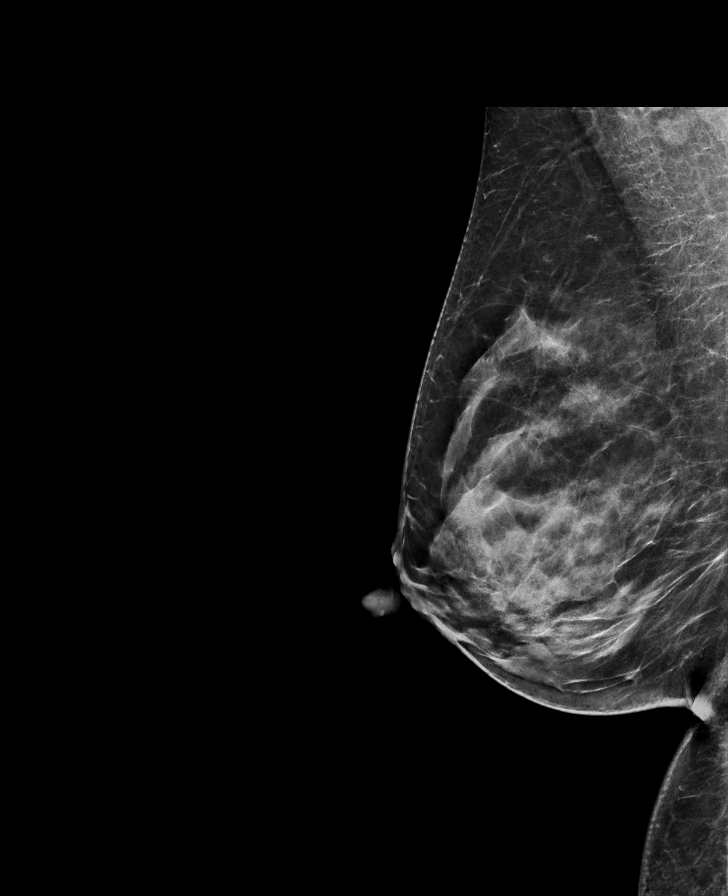

[L MLO synth-2D]
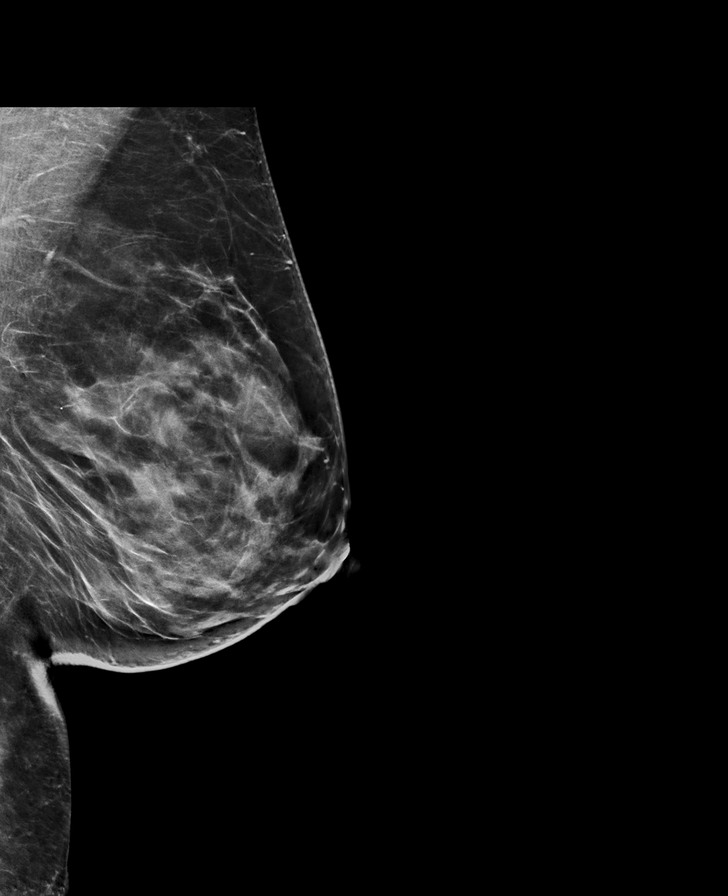

[L CC synth-2D]
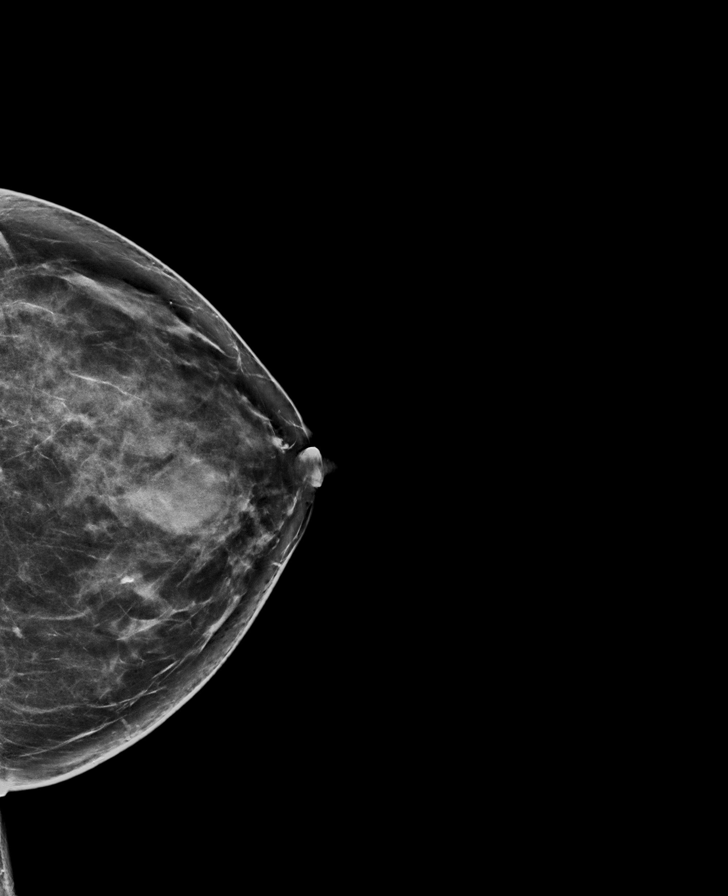

[R MLO tomo · tomo slice 37/72.0]
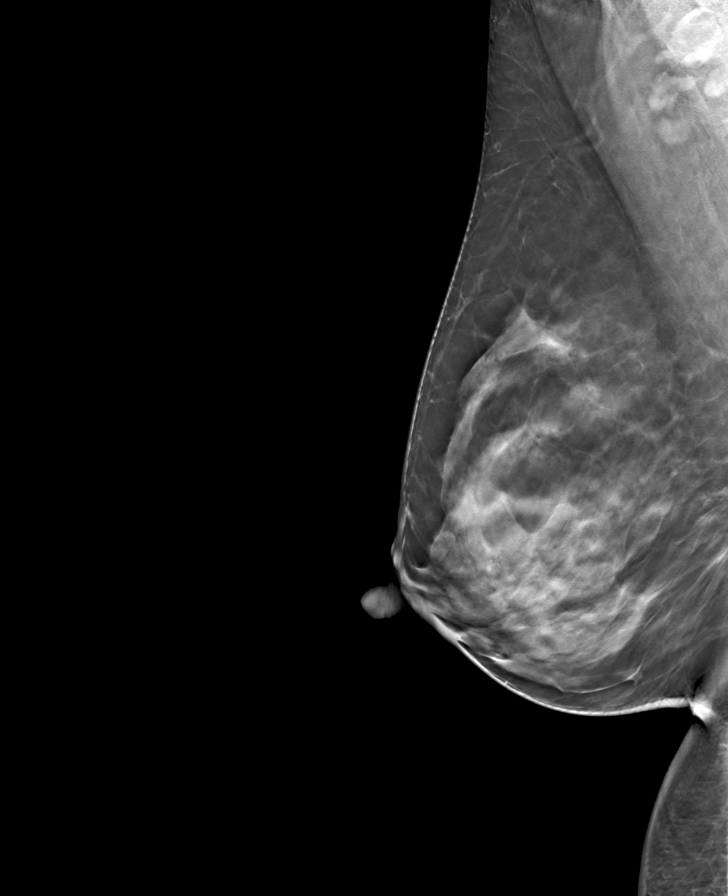

[L CC tomo · tomo slice 39/77.0]
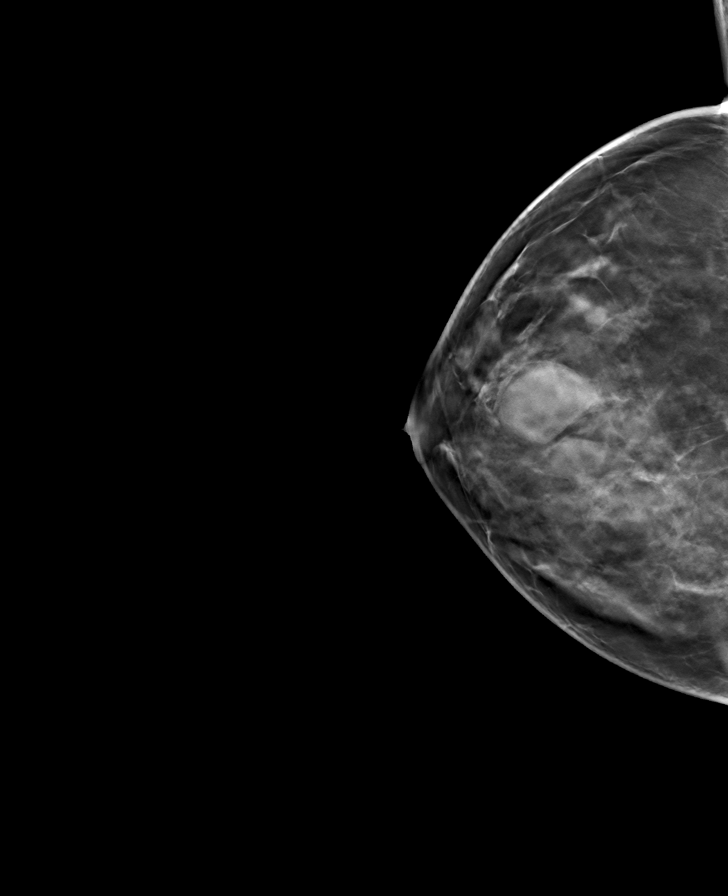

[L MLO tomo · tomo slice 40/79.0]
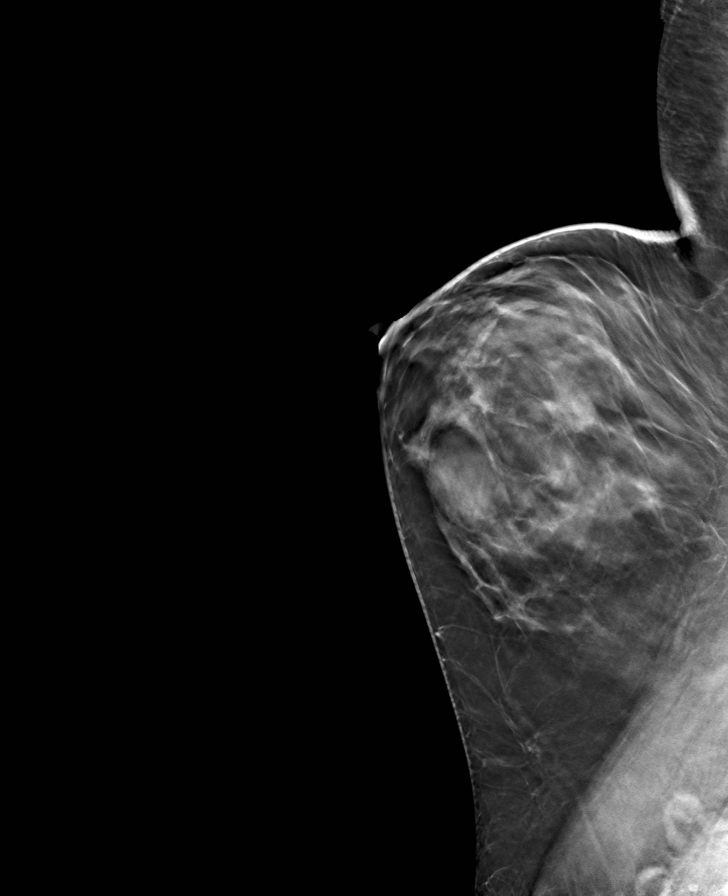

[R CC tomo · tomo slice 43/84.0]
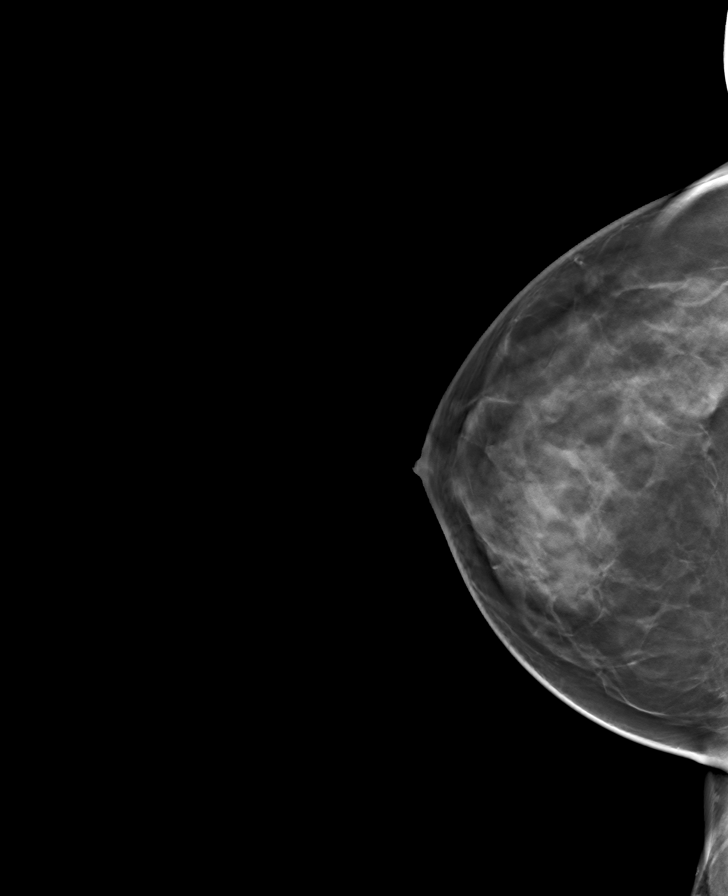

[8 of 24 positions shown; findings below may reference images not displayed]

ACR Breast Density Category c: The breast tissue is heterogeneously
dense, which may obscure small masses.
FINDINGS: There are no findings suspicious for malignancy. Images were
processed with CAD.
IMPRESSION: No mammographic evidence of malignancy. A result letter of this
screening mammogram will be mailed directly to the patient.

RECOMMENDATION:
Screening mammogram in one year. (Code:FT-U-LHB)

BI-RADS CATEGORY  1: Negative.

## 2020-05-01 ENCOUNTER — Other Ambulatory Visit: Payer: Self-pay

## 2020-05-01 ENCOUNTER — Telehealth: Payer: 59 | Admitting: Family Medicine

## 2020-05-01 ENCOUNTER — Encounter: Payer: Self-pay | Admitting: Family Medicine

## 2020-05-01 VITALS — BP 117/63 | HR 76 | Temp 98.1°F | Resp 18 | Ht 63.5 in | Wt 168.2 lb

## 2020-05-01 DIAGNOSIS — M5441 Lumbago with sciatica, right side: Secondary | ICD-10-CM | POA: Diagnosis not present

## 2020-05-01 MED ORDER — METHOCARBAMOL 500 MG PO TABS: 500.0000 mg | ORAL_TABLET | Freq: Four times a day (QID) | ORAL | 0 refills | Status: DC

## 2020-05-01 MED ORDER — PREDNISONE 10 MG PO TABS: ORAL_TABLET | ORAL | 0 refills | Status: DC

## 2020-05-01 MED FILL — METHOCARBAMOL 500 MG TABS: 500 | 11 days supply | Qty: 45 | Fill #0

## 2020-05-01 MED FILL — predniSONE 10 MG TABS: 10 | 18 days supply | Qty: 20 | Fill #0

## 2020-05-01 NOTE — Progress Notes (Signed)
Patient ID: Alice Thomas, female    DOB: [birth year]  Age: 49 y.o. MRN: [id number]    Subjective:  Subjective  HPI [name] presents for low back pain that started last Wednesday.    It is in R hip and radiates to r hip. No known injury   Review of Systems  Constitutional: Negative for appetite change, diaphoresis, fatigue and unexpected weight change.  Eyes: Negative for pain, redness and visual disturbance.  Respiratory: Negative for cough, chest tightness, shortness of breath and wheezing.   Cardiovascular: Negative for chest pain, palpitations and leg swelling.  Endocrine: Negative for cold intolerance, heat intolerance, polydipsia, polyphagia and polyuria.  Genitourinary: Negative for difficulty urinating, dysuria and frequency.  Musculoskeletal: Positive for back pain and gait problem.  Neurological: Negative for dizziness, weakness, light-headedness, numbness and headaches.    History Past Medical History:  Diagnosis Date  . Allergy    seasonal  . Anemia   . History of chicken pox    childhood  . History of kidney stones   . Thyroid disease    hypothyroidism    She has a past surgical history that includes Tonsillectomy and adenoidectomy; Incision and drainage retrophyaryngeal abcess (1992 / 93); Wisdom tooth extraction (1992 / 93); Knee arthroscopy with anterior cruciate ligament (acl) repair (Right, 2006); Hernia repair; and Endometrial biopsy.   Her family history includes Alzheimer's disease in her father; Crohn's disease in her father; Parkinson's disease in her father.She reports that she has been smoking cigarettes. She has a 5.00 pack-year smoking history. She has never used smokeless tobacco. She reports current alcohol use. She reports that she does not use drugs.  Current Outpatient Medications on File Prior to Visit  Medication Sig Dispense Refill  . albuterol (PROVENTIL HFA;VENTOLIN HFA) 108 (90 Base) MCG/ACT inhaler Inhale 2 puffs into the lungs every 6  (six) hours as needed for wheezing or shortness of breath. 1 Inhaler 2  . buPROPion (WELLBUTRIN SR) 100 MG 12 hr tablet Take 1 tablet (100 mg total) by mouth 2 (two) times daily. 180 tablet 1  . Diclofenac Sodium (PENNSAID) 2 % SOLN Place 1 application onto the skin 2 (two) times daily. 112 g 3  . Ibuprofen-Famotidine 800-26.6 MG TABS Take 1 tablet by mouth 3 (three) times daily. 90 tablet 3  . levothyroxine (SYNTHROID) 175 MCG tablet TAKE 1 TABLET BY MOUTH EVERY DAY BEFORE BREAKFAST 90 tablet 1  . oxybutynin (DITROPAN-XL) 5 MG 24 hr tablet TAKE 1 TABLET BY MOUTH EVERYDAY AT BEDTIME 90 tablet 1   No current facility-administered medications on file prior to visit.     Objective:  Objective  Physical Exam Vitals and nursing note reviewed.  Constitutional:      Appearance: She is well-developed.  HENT:     Head: Normocephalic and atraumatic.  Eyes:     Conjunctiva/sclera: Conjunctivae normal.  Neck:     Thyroid: No thyromegaly.     Vascular: No carotid bruit or JVD.  Cardiovascular:     Rate and Rhythm: Normal rate and regular rhythm.     Heart sounds: Normal heart sounds. No murmur heard.   Pulmonary:     Effort: Pulmonary effort is normal. No respiratory distress.     Breath sounds: Normal breath sounds. No wheezing or rales.  Chest:     Chest wall: No tenderness.  Musculoskeletal:        General: Tenderness present.     Cervical back: Normal range of motion and neck supple.  Neurological:  Mental Status: She is alert and oriented to person, place, and time.     Motor: No weakness.     Coordination: Coordination normal.     Gait: Gait normal.     Deep Tendon Reflexes: Reflexes normal.    BP 117/63 (BP Location: Right Arm, Patient Position: Sitting, Cuff Size: Normal)   Pulse 76   Temp 98.1 F (36.7 C) (Oral)   Resp 18   Ht 5' 3.5" (1.613 m)   Wt 168 lb 3.2 oz (76.3 kg)   SpO2 100%   BMI 29.33 kg/m  Wt Readings from Last 3 Encounters:  05/01/20 168 lb 3.2 oz  (76.3 kg)  06/30/19 169 lb (76.7 kg)  06/25/19 165 lb (74.8 kg)     Lab Results  Component Value Date   WBC 4.4 11/13/2018   HGB 14.9 11/13/2018   HCT 43.8 11/13/2018   PLT 250 11/13/2018   GLUCOSE 89 11/13/2018   CHOL 244 (H) 11/13/2018   TRIG 71 11/13/2018   HDL 66 11/13/2018   LDLCALC 161 (H) 11/13/2018   ALT 10 11/13/2018   AST 11 11/13/2018   NA 139 11/13/2018   K 4.8 11/13/2018   CL 103 11/13/2018   CREATININE 0.94 11/13/2018   BUN 16 11/13/2018   CO2 31 11/13/2018   TSH 35.85 (H) 06/30/2019    MM DIAG BREAST TOMO UNI LEFT  Result Date: 11/24/2018 CLINICAL DATA:  Recent CT exam shows a mass in the LEFT breast. Previous mammogram and ultrasound performed in January 2017 demonstrated a fibroadenolipoma in the 12 o'clock location of the LEFT breast. EXAM: DIGITAL DIAGNOSTIC UNILATERAL LEFT MAMMOGRAM WITH CAD AND TOMO COMPARISON:  Multiple prior studies, including 09/18/2018 CT of the chest on 11/16/2018 ACR Breast Density Category c: The breast tissue is heterogeneously dense, which may obscure small masses. FINDINGS: Within the UPPER central portion of the LEFT breast there is a circumscribed oval mass without suspicious microcalcifications or distortion. Mass contains focal areas of lucency and is consistent with benign fibroadenolipoma. The appearance is stable since prior studies and accounts for the CT abnormality. Mammographic images were processed with CAD. IMPRESSION: Stable, benign LEFT breast fibroadenolipoma. No mammographic evidence for malignancy. RECOMMENDATION: Screening mammogram is recommended in December 2020. I have discussed the findings and recommendations with the patient. Results were also provided in writing at the conclusion of the visit. If applicable, a reminder letter will be sent to the patient regarding the next appointment. BI-RADS CATEGORY  2: Benign. Electronically Signed   By: Norva Pavlov M.D.   On: 11/24/2018 15:54     Assessment & Plan:    Plan  I am having Veneta Penton start on predniSONE and methocarbamol. I am also having her maintain her albuterol, oxybutynin, Ibuprofen-Famotidine, Pennsaid, buPROPion, and levothyroxine.  Meds ordered this encounter  Medications  . predniSONE (DELTASONE) 10 MG tablet    Sig: TAKE 3 TABLETS PO QD FOR 3 DAYS THEN TAKE 2 TABLETS PO QD FOR 3 DAYS THEN TAKE 1 TABLET PO QD FOR 3 DAYS THEN TAKE 1/2 TAB PO QD FOR 3 DAYS    Dispense:  20 tablet    Refill:  0  . methocarbamol (ROBAXIN) 500 MG tablet    Sig: Take 1 tablet (500 mg total) by mouth 4 (four) times daily.    Dispense:  45 tablet    Refill:  0    Problem List Items Addressed This Visit    None    Visit Diagnoses  Acute right-sided low back pain with right-sided sciatica    -  Primary   Relevant Medications   predniSONE (DELTASONE) 10 MG tablet   methocarbamol (ROBAXIN) 500 MG tablet    ice, heat, rest  rto if symptoms do not improve with pred taper and muscle relaxer Can still use lidocaine patches   Follow-up: Return if symptoms worsen or fail to improve.  Donato Schultz, DO

## 2020-05-01 NOTE — Patient Instructions (Signed)
Acute Back Pain, Adult Acute back pain is sudden and usually short-lived. It is often caused by an injury to the muscles and tissues in the back. The injury may result from:  A muscle or ligament getting overstretched or torn (strained). Ligaments are tissues that connect bones to each other. Lifting something improperly can cause a back strain.  Wear and tear (degeneration) of the spinal disks. Spinal disks are circular tissue that provides cushioning between the bones of the spine (vertebrae).  Twisting motions, such as while playing sports or doing yard work.  A hit to the back.  Arthritis. You may have a physical exam, lab tests, and imaging tests to find the cause of your pain. Acute back pain usually goes away with rest and home care. Follow these instructions at home: Managing pain, stiffness, and swelling  Take over-the-counter and prescription medicines only as told by your health care provider.  Your health care provider may recommend applying ice during the first 24-48 hours after your pain starts. To do this: ? Put ice in a plastic bag. ? Place a towel between your skin and the bag. ? Leave the ice on for 20 minutes, 2-3 times a day.  If directed, apply heat to the affected area as often as told by your health care provider. Use the heat source that your health care provider recommends, such as a moist heat pack or a heating pad. ? Place a towel between your skin and the heat source. ? Leave the heat on for 20-30 minutes. ? Remove the heat if your skin turns bright red. This is especially important if you are unable to feel pain, heat, or cold. You have a greater risk of getting burned. Activity   Do not stay in bed. Staying in bed for more than 1-2 days can delay your recovery.  Sit up and stand up straight. Avoid leaning forward when you sit, or hunching over when you stand. ? If you work at a desk, sit close to it so you do not need to lean over. Keep your chin tucked  in. Keep your neck drawn back, and keep your elbows bent at a right angle. Your arms should look like the letter "L." ? Sit high and close to the steering wheel when you drive. Add lower back (lumbar) support to your car seat, if needed.  Take short walks on even surfaces as soon as you are able. Try to increase the length of time you walk each day.  Do not sit, drive, or stand in one place for more than 30 minutes at a time. Sitting or standing for long periods of time can put stress on your back.  Do not drive or use heavy machinery while taking prescription pain medicine.  Use proper lifting techniques. When you bend and lift, use positions that put less stress on your back: ? Bend your knees. ? Keep the load close to your body. ? Avoid twisting.  Exercise regularly as told by your health care provider. Exercising helps your back heal faster and helps prevent back injuries by keeping muscles strong and flexible.  Work with a physical therapist to make a safe exercise program, as recommended by your health care provider. Do any exercises as told by your physical therapist. Lifestyle  Maintain a healthy weight. Extra weight puts stress on your back and makes it difficult to have good posture.  Avoid activities or situations that make you feel anxious or stressed. Stress and anxiety increase muscle   tension and can make back pain worse. Learn ways to manage anxiety and stress, such as through exercise. General instructions  Sleep on a firm mattress in a comfortable position. Try lying on your side with your knees slightly bent. If you lie on your back, put a pillow under your knees.  Follow your treatment plan as told by your health care provider. This may include: ? Cognitive or behavioral therapy. ? Acupuncture or massage therapy. ? Meditation or yoga. Contact a health care provider if:  You have pain that is not relieved with rest or medicine.  You have increasing pain going down  into your legs or buttocks.  Your pain does not improve after 2 weeks.  You have pain at night.  You lose weight without trying.  You have a fever or chills. Get help right away if:  You develop new bowel or bladder control problems.  You have unusual weakness or numbness in your arms or legs.  You develop nausea or vomiting.  You develop abdominal pain.  You feel faint. Summary  Acute back pain is sudden and usually short-lived.  Use proper lifting techniques. When you bend and lift, use positions that put less stress on your back.  Take over-the-counter and prescription medicines and apply heat or ice as directed by your health care provider. This information is not intended to replace advice given to you by your health care provider. Make sure you discuss any questions you have with your health care provider. Document Revised: 12/29/2018 Document Reviewed: 04/23/2017 Elsevier Patient Education  2020 [employer]  

## 2020-06-19 ENCOUNTER — Ambulatory Visit (INDEPENDENT_AMBULATORY_CARE_PROVIDER_SITE_OTHER): Payer: PRIVATE HEALTH INSURANCE | Admitting: Psychology

## 2020-06-19 ENCOUNTER — Encounter: Payer: Self-pay | Admitting: Family

## 2020-06-19 DIAGNOSIS — F411 Generalized anxiety disorder: Secondary | ICD-10-CM | POA: Diagnosis not present

## 2020-06-30 NOTE — Telephone Encounter (Signed)
Patient advised we have not received any records on her. She will try to see if they can fax ov note today. Patient advised provider is not in office and will review on Monday if we receive today.

## 2020-07-03 ENCOUNTER — Ambulatory Visit (INDEPENDENT_AMBULATORY_CARE_PROVIDER_SITE_OTHER): Payer: PRIVATE HEALTH INSURANCE | Admitting: Psychology

## 2020-07-03 DIAGNOSIS — F411 Generalized anxiety disorder: Secondary | ICD-10-CM | POA: Diagnosis not present

## 2020-07-10 ENCOUNTER — Ambulatory Visit: Payer: 59 | Admitting: Family

## 2020-07-10 ENCOUNTER — Telehealth: Payer: Self-pay | Admitting: Family

## 2020-07-10 ENCOUNTER — Other Ambulatory Visit: Payer: Self-pay | Admitting: Family

## 2020-07-10 ENCOUNTER — Other Ambulatory Visit: Payer: Self-pay

## 2020-07-10 VITALS — BP 127/85 | HR 78 | Temp 98.4°F | Resp 16 | Ht 63.5 in | Wt 165.0 lb

## 2020-07-10 DIAGNOSIS — R41840 Attention and concentration deficit: Secondary | ICD-10-CM | POA: Diagnosis not present

## 2020-07-10 DIAGNOSIS — Z23 Encounter for immunization: Secondary | ICD-10-CM

## 2020-07-10 DIAGNOSIS — E039 Hypothyroidism, unspecified: Secondary | ICD-10-CM | POA: Diagnosis not present

## 2020-07-10 MED ORDER — SERTRALINE HCL 50 MG PO TABS: ORAL_TABLET | ORAL | 0 refills | Status: DC

## 2020-07-10 MED FILL — SERTRALINE HCL 50 MG TABLET: 50 | 30 days supply | Qty: 30 | Fill #0

## 2020-07-10 NOTE — Patient Instructions (Signed)
Please complete lab work prior to leaving. Begin zoloft 1/2 tab once daily for 1 week then increase to a full tab once daily on week two

## 2020-07-10 NOTE — Telephone Encounter (Signed)
See HPI

## 2020-07-10 NOTE — Progress Notes (Signed)
Subjective:    Patient ID: Alice Thomas, female    DOB: [birth year], 49 y.o.   MRN: [id number]  HPI  Patient is a 49 yr old female who presents today for follow up.  ADHD- not retaining things.  Feels overwhelmed, unable to multitask. Saw a therapist but they discussed anxiety and not ADHD testing.   Anxiety- work is her biggest trigger.  No longer able to multitask.  Uses unisom to help with sleep.    Review of Systems    see HPI  Past Medical History:  Diagnosis Date  . Allergy    seasonal  . Anemia   . History of chicken pox    childhood  . History of kidney stones   . Thyroid disease    hypothyroidism     Social History   Socioeconomic History  . Marital status: Divorced    Spouse name: Not on file  . Number of children: Not on file  . Years of education: Not on file  . Highest education level: Not on file  Occupational History  . Not on file  Tobacco Use  . Smoking status: Current Every Day Smoker    Packs/day: 0.50    Years: 10.00    Pack years: 5.00    Types: Cigarettes  . Smokeless tobacco: Never Used  Vaping Use  . Vaping Use: Never used  Substance and Sexual Activity  . Alcohol use: Yes    Alcohol/week: 0.0 - 4.0 standard drinks  . Drug use: No  . Sexual activity: Yes    Partners: Male    Birth control/protection: I.U.D.  Other Topics Concern  . 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[employer]   Divorced, married x 7 years   Has boyfriend   No children   No pets   Enjoys volunteering and exercise but has not being doing    Social Determinants of Corporate investment banker Strain:   . Difficulty of Paying Living Expenses: Not on file  Food Insecurity:   . Worried About Programme researcher, broadcasting/film/video in the Last Year: Not on file  . Ran Out of Food in the Last Year: Not on file  Transportation Needs:   . Lack of Transportation (Medical): Not on file  . Lack of Transportation (Non-Medical): Not on file  Physical Activity:   . Days of  Exercise per Week: Not on file  . Minutes of Exercise per Session: Not on file  Stress:   . Feeling of Stress : Not on file  Social Connections:   . Frequency of Communication with Friends and Family: Not on file  . Frequency of Social Gatherings with Friends and Family: Not on file  . Attends Religious Services: Not on file  . Active Member of Clubs or Organizations: Not on file  . Attends Banker Meetings: Not on file  . Marital Status: Not on file  Intimate Partner Violence:   . Fear of Current or Ex-Partner: Not on file  . Emotionally Abused: Not on file  . Physically Abused: Not on file  . Sexually Abused: Not on file    Past Surgical History:  Procedure Laterality Date  . ENDOMETRIAL BIOPSY    . HERNIA REPAIR     24 months of age ? abdominal  . INCISION AND DRAINAGE RETROPHYARYNGEAL ABCESS  1992 / 93  . KNEE ARTHROSCOPY WITH ANTERIOR CRUCIATE LIGAMENT (ACL) REPAIR Right 2006  . TONSILLECTOMY AND ADENOIDECTOMY  childhood  . WISDOM TOOTH EXTRACTION  1992 / 15    Family History  Problem Relation Age of Onset  . Crohn's disease Father   . Alzheimer's disease Father   . Parkinson's disease Father     No Known Allergies  Current Outpatient Medications on File Prior to Visit  Medication Sig Dispense Refill  . Ibuprofen-Famotidine 800-26.6 MG TABS Take 1 tablet by mouth 3 (three) times daily. 90 tablet 3  . levothyroxine (SYNTHROID) 175 MCG tablet TAKE 1 TABLET BY MOUTH EVERY DAY BEFORE BREAKFAST 90 tablet 1   No current facility-administered medications on file prior to visit.    BP 127/85 (BP Location: Right Arm, Patient Position: Sitting, Cuff Size: Small)   Pulse 78   Temp 98.4 F (36.9 C) (Oral)   Resp 16   Ht 5' 3.5" (1.613 m)   Wt 165 lb (74.8 kg)   SpO2 100%   BMI 28.77 kg/m    Objective:   Physical Exam Constitutional:      Appearance: Normal appearance.  HENT:     Head: Normocephalic and atraumatic.  Skin:    General: Skin is  warm and dry.  Neurological:     Mental Status: She is alert.  Psychiatric:        Mood and Affect: Affect is tearful.        Speech: Speech normal.        Behavior: Behavior normal.        Cognition and Memory: Cognition normal.           Assessment & Plan:  Attention deficit-  Will refer to [facility] for formal evaluation and treatment.  Anxiety- uncontrolled. Trial of zoloft.   I instructed pt to start 1/2 tablet once daily for 1 week and then increase to a full tablet once daily on week two as tolerated.  We discussed common side effects such as nausea, drowsiness and weight gain.   She is instructed to discontinue medication go directly to ED if this occurs.  Pt verbalizes understanding.  Plan follow up in 1 month to evaluate progress.

## 2020-07-11 ENCOUNTER — Telehealth: Payer: Self-pay | Admitting: Family

## 2020-07-11 ENCOUNTER — Other Ambulatory Visit: Payer: Self-pay

## 2020-07-11 DIAGNOSIS — E039 Hypothyroidism, unspecified: Secondary | ICD-10-CM

## 2020-07-11 LAB — TSH: TSH: 0.01 mIU/L — ABNORMAL LOW

## 2020-07-11 MED ORDER — LEVOTHYROXINE SODIUM 150 MCG PO TABS: 150.0000 ug | ORAL_TABLET | Freq: Every day | ORAL | 1 refills | Status: DC

## 2020-07-11 NOTE — Telephone Encounter (Signed)
Please advise pt lab work shows synthroid is too stong. I would like her to decrease dose from to and repeat TSH in 6 weeks. Dx hypothyroid. I sent the rx. to her mail order.

## 2020-07-11 NOTE — Addendum Note (Signed)
Addended by: Wilford Corner on: 07/11/2020 02:08 PM   Modules accepted: Orders

## 2020-07-11 NOTE — Telephone Encounter (Signed)
Patient advised of results and medication dose change. She did not want to schedule labs at this time, order entered as future.

## 2020-07-19 ENCOUNTER — Telehealth: Payer: Self-pay | Admitting: Family

## 2020-07-19 ENCOUNTER — Other Ambulatory Visit: Payer: Self-pay | Admitting: Family

## 2020-07-19 NOTE — Telephone Encounter (Signed)
See mychart.  

## 2020-07-24 ENCOUNTER — Ambulatory Visit: Payer: PRIVATE HEALTH INSURANCE | Admitting: Psychology

## 2020-08-01 ENCOUNTER — Other Ambulatory Visit: Payer: Self-pay

## 2020-08-01 ENCOUNTER — Encounter: Payer: Self-pay | Admitting: Family

## 2020-08-01 ENCOUNTER — Telehealth (INDEPENDENT_AMBULATORY_CARE_PROVIDER_SITE_OTHER): Payer: 59 | Admitting: Family

## 2020-08-01 DIAGNOSIS — E663 Overweight: Secondary | ICD-10-CM

## 2020-08-01 DIAGNOSIS — F411 Generalized anxiety disorder: Secondary | ICD-10-CM

## 2020-08-01 DIAGNOSIS — Z720 Tobacco use: Secondary | ICD-10-CM | POA: Diagnosis not present

## 2020-08-01 MED ORDER — SERTRALINE HCL 50 MG PO TABS: 50.0000 mg | ORAL_TABLET | Freq: Every day | ORAL | 1 refills | Status: DC

## 2020-08-01 NOTE — Progress Notes (Signed)
Virtual Visit via Video Note  I connected with Alice Thomas on 08/01/20 at  4:40 PM EST by a video enabled telemedicine application and verified that I am speaking with the correct person using two identifiers.  Location: Patient: home Provider: work   I discussed the limitations of evaluation and management by telemedicine and the availability of in person appointments. The patient expressed understanding and agreed to proceed. Only the patient and myself were present for today's video call.   History of Present Illness:  Anxiety-reports that zoloft is helping tremedously. States that she is no longer feeling scatter brained.  Feels like she can accomplish things. States that she feels "normal." She is pleased with the result and denies any side effects.    155 lb today at home. Doing Optavia.   10/4 163.6 10/11 160.8 Wt Readings from Last 3 Encounters:  07/10/20 165 lb (74.8 kg)  05/01/20 168 lb 3.2 oz (76.3 kg)  06/30/19 169 lb (76.7 kg)       Observations/Objective:   Gen: Awake, alert, no acute distress Resp: Breathing is even and non-labored Psych: calm/pleasant demeanor Neuro: Alert and Oriented x 3, + facial symmetry, speech is clear.   Assessment and Plan:  Anxiety- improved. Will continue zoloft 50mg  once daily. She states that she is no longer having issues with concentration.  As a result, she did not schedule a consult at [facility].  I told her I think it is fine to hold off on this as we can always revisit again later if she has recurrent attention issues.   Overweight- she is doing well on Optavia and has lost about 8 pounds since her last visit.  Commended her on this work.   Tobacco abuse- discussed smoking cessation. She is considering switching to nicotine lozenges and then weaning off of the lozenges.   This visit occurred during the SARS-CoV-2 public health emergency.  Safety protocols were in place, including screening questions  prior to the visit, additional usage of staff PPE, and extensive cleaning of exam room while observing appropriate contact time as indicated for disinfecting solutions.    Follow Up Instructions:    I discussed the assessment and treatment plan with the patient. The patient was provided an opportunity to ask questions and all were answered. The patient agreed with the plan and demonstrated an understanding of the instructions.   The patient was advised to call back or seek an in-person evaluation if the symptoms worsen or if the condition fails to improve as anticipated.  [place], NP

## 2021-01-19 ENCOUNTER — Other Ambulatory Visit: Payer: Self-pay | Admitting: Family

## 2021-02-02 ENCOUNTER — Other Ambulatory Visit: Payer: Self-pay | Admitting: Family

## 2021-09-10 ENCOUNTER — Encounter: Payer: Self-pay | Admitting: Family

## 2021-09-10 MED ORDER — VALACYCLOVIR HCL 1 G PO TABS: 1000.0000 mg | ORAL_TABLET | Freq: Every day | ORAL | 1 refills | Status: AC

## 2021-09-30 ENCOUNTER — Other Ambulatory Visit: Payer: Self-pay | Admitting: Family

## 2021-12-14 ENCOUNTER — Other Ambulatory Visit (HOSPITAL_COMMUNITY)
Admission: RE | Admit: 2021-12-14 | Discharge: 2021-12-14 | Disposition: A | Discharge status: Home / Self Care | Payer: 59 | Source: Ambulatory Visit | Attending: Family | Admitting: Family

## 2021-12-14 ENCOUNTER — Encounter: Payer: Self-pay | Admitting: Family

## 2021-12-14 ENCOUNTER — Ambulatory Visit (INDEPENDENT_AMBULATORY_CARE_PROVIDER_SITE_OTHER): Payer: 59 | Admitting: Family

## 2021-12-14 VITALS — BP 120/76 | HR 98 | Temp 98.5°F | Resp 16 | Ht 63.5 in | Wt 163.0 lb

## 2021-12-14 DIAGNOSIS — Z01419 Encounter for gynecological examination (general) (routine) without abnormal findings: Secondary | ICD-10-CM

## 2021-12-14 DIAGNOSIS — Z1211 Encounter for screening for malignant neoplasm of colon: Secondary | ICD-10-CM

## 2021-12-14 DIAGNOSIS — Z1231 Encounter for screening mammogram for malignant neoplasm of breast: Secondary | ICD-10-CM | POA: Diagnosis not present

## 2021-12-14 DIAGNOSIS — Z23 Encounter for immunization: Secondary | ICD-10-CM | POA: Diagnosis not present

## 2021-12-14 DIAGNOSIS — M65341 Trigger finger, right ring finger: Secondary | ICD-10-CM

## 2021-12-14 DIAGNOSIS — Z0000 Encounter for general adult medical examination without abnormal findings: Secondary | ICD-10-CM

## 2021-12-14 DIAGNOSIS — N912 Amenorrhea, unspecified: Secondary | ICD-10-CM

## 2021-12-14 DIAGNOSIS — E039 Hypothyroidism, unspecified: Secondary | ICD-10-CM

## 2021-12-14 DIAGNOSIS — Z720 Tobacco use: Secondary | ICD-10-CM

## 2021-12-14 DIAGNOSIS — F411 Generalized anxiety disorder: Secondary | ICD-10-CM

## 2021-12-14 MED ORDER — HYDROXYZINE PAMOATE 25 MG PO CAPS: 25.0000 mg | ORAL_CAPSULE | Freq: Every evening | ORAL | 5 refills | Status: DC | PRN

## 2021-12-14 NOTE — Assessment & Plan Note (Addendum)
Continues healthy diet, exercise.  Shingrix #1 and tetanus today. Recommended that she get bivalent. Will schedule dental exam.  ?

## 2021-12-14 NOTE — Patient Instructions (Signed)
Please continue your work on healthy diet, exercise and weight loss. ?You should be contacted about scheduling your mammogram and colonoscopy.  ?

## 2021-12-14 NOTE — Assessment & Plan Note (Addendum)
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  Not ready to quit. Discussed trial of Chantix when she is ready. She will let me know.  ?

## 2021-12-14 NOTE — Assessment & Plan Note (Signed)
Clinically stable on synthroid. Will obtain follow up TSH.  ?

## 2021-12-14 NOTE — Assessment & Plan Note (Signed)
She has a mirena which I believe is >52 yrs old.  She is not having periods. Will check hormone levels. If menopausal, plan IUD removal. If not menopausal, plan referral to GYN for IUD replacement.  ?

## 2021-12-14 NOTE — Assessment & Plan Note (Signed)
New. Wishes to hold off on any referrals at this time.  ?

## 2021-12-14 NOTE — Progress Notes (Addendum)
? ?Subjective:  ? ?By signing my name below, I, [name], attest that this documentation has been prepared under the direction and in the presence of Debbrah Alar NP, 12/14/2021 ? ? Patient ID: Alice Thomas, female    DOB: 07-Oct-1970, 51 y.o.   MRN: [id number] ? ?No chief complaint on file. ? ? ?HPI ?Patient is in today for a comprehensive physical exam. ? ?Restless sleep - She complains of restless sleep. She would go to sleep for about 3 minutes before waking up. When she's awake,she begins to shake. It takes her a couple of hours to fall back asleep. She is currently taking Unisom to help alleviate symptoms.  ? ?Trigger Fingers - She also complains of trigger fingers in her right hand.  ? ?Smoking - She is continuing to take 1/2 pack of cigarettes and nicotine lozenges ? ?Menstruation - She does not menstruate. She received a Mirena IUD around 2015. She reports the occasional hot flashes which frequent in the evening.  ? ?She denies having any fever, ear pain, new muscle pain, joint pain, new moles, congestion, sinus pain, sore throat, palpations, wheezing, n/v/d, constipation, blood in stool, dysuria, frequency, hematuria at this time. ? ?Social History: She denies of recent surgeries and changes in family history. She reports that her Aunt who was diagnosed with breast cancer. ?Pap Smear: Last completed 05/21/2017 ?Mammogram: Last completed 11/24/2018 ?Immunizations: She is interested in receiving the Hepatitis C screening. She is thinking about scheduling an appointment to get a Shingrix vaccine.  ?Diet: She is improving her diet ?Exercise: She has been exercising regularly  ?Dental: She is not UTD on dental exams.  ?Vision: She has an appointment scheduled in May 2023.  ?Health Maintenance Due  ?Topic Date Due  ? Hepatitis C Screening  Never done  ? COLONOSCOPY (Pts 45-67yr Insurance coverage will need to be confirmed)  Never done  ? MAMMOGRAM  09/19/2019  ? COVID-19 Vaccine (3 - Booster for  Moderna series) 05/17/2020  ? PAP SMEAR-Modifier  05/21/2020  ? ? ?Past Medical History:  ?Diagnosis Date  ? Allergy   ? seasonal  ? Anemia   ? History of chicken pox   ? childhood  ? History of kidney stones   ? Thyroid disease   ? hypothyroidism  ? ? ?Past Surgical History:  ?Procedure Laterality Date  ? ENDOMETRIAL BIOPSY    ? HERNIA REPAIR    ? 6103months of age ? abdominal  ? INCISION AND DRAINAGE RETROPHYARYNGEAL ABCESS  1992 / 93  ? KNEE ARTHROSCOPY WITH ANTERIOR CRUCIATE LIGAMENT (ACL) REPAIR Right 2006  ? TONSILLECTOMY AND ADENOIDECTOMY    ? childhood  ? [place]/ 93  ? ? ?Family History  ?Problem Relation Age of Onset  ? Crohn's disease Father   ? Alzheimer's disease Father   ? Parkinson's disease Father   ? Hypothyroidism Sister   ? Heart disease Paternal Grandfather   ? Breast cancer Paternal Aunt   ? ? ?Social History  ? ?Socioeconomic History  ? Marital status: Divorced  ?  Spouse name: Not on file  ? Number of children: Not on file  ? Years of education: Not on file  ? Highest education level: Not on file  ?Occupational History  ? Not on file  ?Tobacco Use  ? Smoking status: Every Day  ?  Packs/day: 0.50  ?  Years: 10.00  ?  Pack years: 5.00  ?  Types: Cigarettes  ? Smokeless tobacco: Never  ?  Vaping Use  ? Vaping Use: Never used  ?Substance and Sexual Activity  ? Alcohol use: Yes  ?  Alcohol/week: 0.0 - 4.0 standard drinks  ? Drug use: No  ? Sexual activity: Yes  ?  Partners: Male  ?  Birth control/protection: I.U.D.  ?Other Topics Concern  ? Not on file  ?Social History Narrative  ? 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 Married (second marriage)  ? No children  ? No pets  ? Enjoys volunteering and exercise but has not being doing   ? ?Social Determinants of Health  ? ?Financial Resource Strain: Not on file  ?Food Insecurity: Not on file  ?Transportation Needs: Not on file  ?Physical Activity: Not on file  ?Stress: Not on file  ?Social Connections: Not on file  ?Intimate Partner Violence: Not on file   ? ? ?Outpatient Medications Prior to Visit  ?Medication Sig Dispense Refill  ? Ibuprofen-Famotidine 800-26.6 MG TABS Take 1 tablet by mouth 3 (three) times daily. 90 tablet 3  ? levothyroxine (SYNTHROID) 150 MCG tablet TAKE 1 TABLET BY MOUTH EVERY DAY 90 tablet 1  ? sertraline (ZOLOFT) 50 MG tablet TAKE 1 TABLET BY MOUTH EVERY DAY 90 tablet 1  ? ?No facility-administered medications prior to visit.  ? ? ?No Known Allergies ? ?Review of Systems  ?Constitutional:  Negative for fever.  ?HENT:  Negative for congestion, ear pain, sinus pain and sore throat.   ?Respiratory:  Negative for wheezing.   ?Cardiovascular:  Negative for palpitations.  ?Gastrointestinal:  Negative for blood in stool, constipation, diarrhea, nausea and vomiting.  ?Genitourinary:  Negative for dysuria, frequency and hematuria.  ?Musculoskeletal:  Negative for joint pain and myalgias.  ?     Trigger Finger in Right Hand  ?Skin:   ?     (-) New Moles  ?Psychiatric/Behavioral:  Negative for depression. The patient has insomnia. The patient is not nervous/anxious.   ? ?   ?Objective:  ?  ?Physical Exam ?Exam conducted with a chaperone present.  ?Constitutional:   ?   General: She is not in acute distress. ?   Appearance: Normal appearance. She is not ill-appearing.  ?HENT:  ?   Head: Normocephalic and atraumatic.  ?   Right Ear: Tympanic membrane, ear canal and external ear normal.  ?   Left Ear: Tympanic membrane, ear canal and external ear normal.  ?Eyes:  ?   Extraocular Movements: Extraocular movements intact.  ?   Pupils: Pupils are equal, round, and reactive to light.  ?Neck:  ?   Thyroid: No thyromegaly.  ?Cardiovascular:  ?   Rate and Rhythm: Normal rate and regular rhythm.  ?   Heart sounds: Normal heart sounds. No murmur heard. ?  No gallop.  ?Pulmonary:  ?   Effort: Pulmonary effort is normal. No respiratory distress.  ?   Breath sounds: Normal breath sounds. No wheezing or rales.  ?Chest:  ?Breasts: ?   Breasts are symmetrical.  ?   Right:  No inverted nipple or mass.  ?   Left: No inverted nipple or mass.  ?Abdominal:  ?   General: Bowel sounds are normal. There is no distension.  ?   Palpations: Abdomen is soft.  ?   Tenderness: There is no abdominal tenderness. There is no guarding.  ?Genitourinary: ?   General: Normal vulva.  ?   Exam position: Lithotomy position.  ?   Labia:     ?   Right: No rash.     ?  Left: No rash.   ?   Urethra: No prolapse.  ?   Vagina: Normal.  ?   Cervix: Normal.  ?   Uterus: Normal.   ?   Comments: Mirena string present ?Lymphadenopathy:  ?   Cervical: No cervical adenopathy.  ?Skin: ?   General: Skin is warm.  ?Neurological:  ?   Mental Status: She is alert and oriented to person, place, and time.  ?   Deep Tendon Reflexes:  ?   Reflex Scores: ?     Patellar reflexes are 3+ on the right side and 3+ on the left side. ?   Comments: 5/5 strength in both upper and lower extremities   ?Psychiatric:     ?   Mood and Affect: Mood normal.     ?   Behavior: Behavior normal.     ?   Judgment: Judgment normal.  ? ? ?BP 120/76 (BP Location: Right Arm, Patient Position: Sitting, Cuff Size: Small)   Pulse 98   Temp 98.5 ?F (36.9 ?C) (Oral)   Resp 16   Ht 5' 3.5" (1.613 m)   Wt 163 lb (73.9 kg)   SpO2 98%   BMI 28.42 kg/m?  ?Wt Readings from Last 3 Encounters:  ?12/14/21 163 lb (73.9 kg)  ?07/10/20 165 lb (74.8 kg)  ?05/01/20 168 lb 3.2 oz (76.3 kg)  ? ? ?   ?Assessment & Plan:  ? ?Problem List Items Addressed This Visit   ? ?  ? Unprioritized  ? Trigger ring finger of right hand  ?  New. Wishes to hold off on any referrals at this time.  ?  ?  ? Tobacco abuse  ?  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  Not ready to quit. Discussed trial of Chantix when she is ready. She will let me know.  ?  ?  ? Preventative health care - Primary  ?  Continues healthy diet, exercise.  Shingrix #1 and tetanus today. Recommended that she get bivalent. Will schedule dental exam.  ?  ?  ? Relevant Orders  ? Hepatitis C Antibody  ? Comp Met (CMET)  ?  Lipid panel  ? VITAMIN D 25 Hydroxy (Vit-D Deficiency, Fractures)  ? CBC with Differential/Platelet  ? Hypothyroidism  ?  Clinically stable on synthroid. Will obtain follow up TSH.  ?  ?  ? Relevant Orders  ? TSH

## 2021-12-14 NOTE — Assessment & Plan Note (Signed)
Currently stable. Continue current dose of zoloft.  ?

## 2021-12-15 LAB — LUTEINIZING HORMONE: LH: 38.2 m[IU]/mL

## 2021-12-15 LAB — FOLLICLE STIMULATING HORMONE: FSH: 83.3 m[IU]/mL

## 2021-12-16 ENCOUNTER — Telehealth: Payer: Self-pay | Admitting: Family

## 2021-12-16 NOTE — Telephone Encounter (Signed)
See mychart.  

## 2021-12-17 ENCOUNTER — Telehealth: Payer: Self-pay | Admitting: Family

## 2021-12-17 ENCOUNTER — Other Ambulatory Visit: Payer: Self-pay

## 2021-12-17 DIAGNOSIS — F411 Generalized anxiety disorder: Secondary | ICD-10-CM

## 2021-12-17 LAB — ESTRADIOL: Estradiol: <15 pg/mL

## 2021-12-17 LAB — CBC WITH DIFFERENTIAL/PLATELET
Absolute Monocytes: 462 cells/uL (ref 200–950)
Basophils Absolute: 28 cells/uL (ref 0–200)
Basophils Relative: 0.5 %
Eosinophils Absolute: 50 cells/uL (ref 15–500)
Eosinophils Relative: 0.9 %
HCT: 44.5 % (ref 35.0–45.0)
Hemoglobin: 14.7 g/dL (ref 11.7–15.5)
Lymphs Abs: 2030 cells/uL (ref 850–3900)
MCH: 30.1 pg (ref 27.0–33.0)
MCHC: 33 g/dL (ref 32.0–36.0)
MCV: 91.2 fL (ref 80.0–100.0)
MPV: 10.1 fL (ref 7.5–12.5)
Monocytes Relative: 8.4 %
Neutro Abs: 2932 cells/uL (ref 1500–7800)
Neutrophils Relative %: 53.3 %
Platelets: 262 10*3/uL (ref 140–400)
RBC: 4.88 10*6/uL (ref 3.80–5.10)
RDW: 12.5 % (ref 11.0–15.0)
Total Lymphocyte: 36.9 %
WBC: 5.5 10*3/uL (ref 3.8–10.8)

## 2021-12-17 LAB — LIPID PANEL
Cholesterol: 257 mg/dL — ABNORMAL HIGH (ref <200)
HDL: 75 mg/dL (ref >=50)
LDL Cholesterol (Calc): 168 mg/dL (calc) — ABNORMAL HIGH
Non-HDL Cholesterol (Calc): 182 mg/dL (calc) — ABNORMAL HIGH (ref <130)
Total CHOL/HDL Ratio: 3.4 (calc) (ref <5.0)
Triglycerides: 50 mg/dL (ref <150)

## 2021-12-17 LAB — COMPREHENSIVE METABOLIC PANEL
AG Ratio: 2 (calc) (ref 1.0–2.5)
ALT: 13 U/L (ref 6–29)
AST: 14 U/L (ref 10–35)
Albumin: 4.7 g/dL (ref 3.6–5.1)
Alkaline phosphatase (APISO): 47 U/L (ref 37–153)
BUN: 22 mg/dL (ref 7–25)
CO2: 25 mmol/L (ref 20–32)
Calcium: 10.8 mg/dL — ABNORMAL HIGH (ref 8.6–10.4)
Chloride: 103 mmol/L (ref 98–110)
Creat: 0.91 mg/dL (ref 0.50–1.03)
Globulin: 2.4 g/dL (calc) (ref 1.9–3.7)
Glucose, Bld: 101 mg/dL — ABNORMAL HIGH (ref 65–99)
Potassium: 4.3 mmol/L (ref 3.5–5.3)
Sodium: 140 mmol/L (ref 135–146)
Total Bilirubin: 0.5 mg/dL (ref 0.2–1.2)
Total Protein: 7.1 g/dL (ref 6.1–8.1)

## 2021-12-17 LAB — CYTOLOGY - PAP
Adequacy: ABSENT
Comment: NEGATIVE
Diagnosis: NEGATIVE
High risk HPV: NEGATIVE

## 2021-12-17 LAB — TSH: TSH: 1.38 mIU/L

## 2021-12-17 LAB — HEPATITIS C ANTIBODY
Hepatitis C Ab: NONREACTIVE
SIGNAL TO CUT-OFF: 0.05 (ref <1.00)

## 2021-12-17 LAB — VITAMIN D 25 HYDROXY (VIT D DEFICIENCY, FRACTURES): Vit D, 25-Hydroxy: 40 ng/mL (ref 30–100)

## 2021-12-17 NOTE — Telephone Encounter (Signed)
Patient scheduled to come in tomorrow for her PTH and UDS. Advised ok per lab to come before or after the mammogram. Contract up-front in the contracts folder.  Patient notified of appointment and contract. ?

## 2021-12-17 NOTE — Telephone Encounter (Signed)
See mychart.  

## 2021-12-18 ENCOUNTER — Ambulatory Visit (HOSPITAL_BASED_OUTPATIENT_CLINIC_OR_DEPARTMENT_OTHER)
Admission: RE | Admit: 2021-12-18 | Discharge: 2021-12-18 | Disposition: A | Discharge status: Home / Self Care | Payer: 59 | Source: Ambulatory Visit | Attending: Family | Admitting: Family

## 2021-12-18 ENCOUNTER — Other Ambulatory Visit: Payer: Self-pay

## 2021-12-18 ENCOUNTER — Other Ambulatory Visit (INDEPENDENT_AMBULATORY_CARE_PROVIDER_SITE_OTHER): Payer: 59

## 2021-12-18 ENCOUNTER — Encounter (HOSPITAL_BASED_OUTPATIENT_CLINIC_OR_DEPARTMENT_OTHER): Payer: Self-pay

## 2021-12-18 DIAGNOSIS — Z1231 Encounter for screening mammogram for malignant neoplasm of breast: Secondary | ICD-10-CM | POA: Diagnosis present

## 2021-12-18 DIAGNOSIS — F411 Generalized anxiety disorder: Secondary | ICD-10-CM

## 2021-12-19 LAB — DRUG MONITORING PANEL 376104, URINE
Amphetamines: NEGATIVE ng/mL (ref <500)
Barbiturates: NEGATIVE ng/mL (ref <300)
Benzodiazepines: NEGATIVE ng/mL (ref <100)
Cocaine Metabolite: NEGATIVE ng/mL (ref <150)
Desmethyltramadol: NEGATIVE ng/mL (ref <100)
Opiates: NEGATIVE ng/mL (ref <100)
Oxycodone: NEGATIVE ng/mL (ref <100)
Tramadol: NEGATIVE ng/mL (ref <100)

## 2021-12-19 LAB — DM TEMPLATE

## 2021-12-21 LAB — PTH, INTACT (ICMA) AND IONIZED CALCIUM
Calcium: 10.1 mg/dL (ref 8.6–10.4)
PTH: 13 pg/mL — ABNORMAL LOW (ref 16–77)

## 2021-12-24 ENCOUNTER — Encounter: Payer: Self-pay | Admitting: Family

## 2021-12-24 MED ORDER — ZOLPIDEM TARTRATE 5 MG PO TABS: 5.0000 mg | ORAL_TABLET | Freq: Every evening | ORAL | 1 refills | Status: DC | PRN

## 2022-01-05 ENCOUNTER — Other Ambulatory Visit: Payer: Self-pay | Admitting: Family

## 2022-01-16 ENCOUNTER — Ambulatory Visit: Payer: 59 | Admitting: Family Medicine

## 2022-01-16 ENCOUNTER — Ambulatory Visit: Payer: Self-pay

## 2022-01-16 VITALS — BP 108/68 | Ht 63.5 in | Wt 163.0 lb

## 2022-01-16 DIAGNOSIS — M23203 Derangement of unspecified medial meniscus due to old tear or injury, right knee: Secondary | ICD-10-CM

## 2022-01-16 MED ORDER — TRIAMCINOLONE ACETONIDE 40 MG/ML IJ SUSP
40.0000 mg | Freq: Once | INTRAMUSCULAR | Status: AC
  Administered 2022-01-16: 40 mg via INTRA_ARTICULAR

## 2022-01-16 NOTE — Patient Instructions (Signed)
Good to see you ?Please use ice as needed  ?Please try compression  ?Please try the exercises   ?Please send me a message in MyChart with any questions or updates.  ?Please see me back in 4 weeks or as needed if better.  ? ?--Dr. Raeford Razor ? ?

## 2022-01-16 NOTE — Assessment & Plan Note (Signed)
Acute on chronic in nature.  Having degenerative changes with cyst on the medial compartment. ?-Counseled on home exercise therapy and supportive care. ?-Aspiration and injection today. ?-Could consider further imaging or physical therapy. ?

## 2022-01-16 NOTE — Progress Notes (Signed)
?  Alice Thomas - 51 y.o. female MRN [id number]  Date of birth: 1971-03-30 ? ?SUBJECTIVE:  Including CC & ROS.  ?No chief complaint on file. ? ? ?Alice Thomas is a 51 y.o. female that is presenting with acute on chronic right knee pain.  The pain is occurring over the medial compartment.  Was doing Zumba when she noticed having the pain. ? ? ? ?Review of Systems ?See HPI  ? ?HISTORY: Past Medical, Surgical, Social, and Family History Reviewed & Updated per EMR.   ?Pertinent Historical Findings include: ? ?Past Medical History:  ?Diagnosis Date  ? Allergy   ? seasonal  ? Anemia   ? History of chicken pox   ? childhood  ? History of kidney stones   ? Thyroid disease   ? hypothyroidism  ? ? ?Past Surgical History:  ?Procedure Laterality Date  ? ENDOMETRIAL BIOPSY    ? HERNIA REPAIR    ? 73 months of age ? abdominal  ? INCISION AND DRAINAGE RETROPHYARYNGEAL ABCESS  1992 / 93  ? KNEE ARTHROSCOPY WITH ANTERIOR CRUCIATE LIGAMENT (ACL) REPAIR Right 2006  ? TONSILLECTOMY AND ADENOIDECTOMY    ? childhood  ? WISDOM TOOTH EXTRACTION  1992 / 93  ? ? ? ?PHYSICAL EXAM:  ?VS: BP 108/68 (BP Location: Left Arm, Patient Position: Sitting)   Ht 5' 3.5" (1.613 m)   Wt 163 lb (73.9 kg)   BMI 28.42 kg/m?  ?Physical Exam ?Gen: NAD, alert, cooperative with exam, well-appearing ?MSK:  ?Neurovascularly intact   ? ?Limited ultrasound: Right knee: ? ?Mild effusion suprapatellar pouch. ?Normal-appearing quadricep and patellar tendon. ?Meniscal cyst appreciated medially. ?No significant changes laterally. ? ?Summary: Degenerative changes of medial meniscus with meniscal cyst. ? ?Ultrasound and interpretation by Clare Gandy, MD ? ? ?Aspiration/Injection Procedure Note ?Alice Thomas ?07/15/1971 ? ?Procedure: Aspiration and Injection ?Indications: Right knee pain ? ?Procedure Details ?Consent: Risks of procedure as well as the alternatives and risks of each were explained to the (patient/caregiver).  Consent for procedure obtained. ?Time Out:  Verified patient identification, verified procedure, site/side was marked, verified correct patient position, special equipment/implants available, medications/allergies/relevent history reviewed, required imaging and test results available.  Performed.  The area was cleaned with iodine and alcohol swabs.   ? ?The right knee medial meniscal cyst was injected using 3 cc 1% lidocaine on a 25-gauge 1-1/2 inch needle.  An 18-gauge 1/2 inch needle was used to achieve aspiration.  The syringe was switched to mixture containing 1 cc's of 40 mg Kenalog and 4 cc's of 0.25% bupivacaine was injected.  Ultrasound was used. Images were obtained in long views showing the injection.   ? ?Amount of Fluid Aspirated: minimal amount ?Character of Fluid: gelatinous ?Fluid was sent for: n/a ? ?A sterile dressing was applied. ? ?Patient did tolerate procedure well. ? ? ? ?ASSESSMENT & PLAN:  ? ?Degenerative tear of medial meniscus of right knee ?Acute on chronic in nature.  Having degenerative changes with cyst on the medial compartment. ?-Counseled on home exercise therapy and supportive care. ?-Aspiration and injection today. ?-Could consider further imaging or physical therapy. ? ? ? ? ?

## 2022-02-20 ENCOUNTER — Encounter: Payer: Self-pay | Admitting: Family

## 2022-02-20 MED ORDER — ZOLPIDEM TARTRATE 10 MG PO TABS: 10.0000 mg | ORAL_TABLET | Freq: Every evening | ORAL | 0 refills | Status: DC | PRN

## 2022-05-17 ENCOUNTER — Other Ambulatory Visit: Payer: Self-pay | Admitting: Family

## 2022-06-18 ENCOUNTER — Ambulatory Visit: Payer: 59 | Admitting: Family

## 2022-06-18 VITALS — BP 123/83 | HR 81 | Temp 98.1°F | Resp 16 | Wt 185.0 lb

## 2022-06-18 DIAGNOSIS — F411 Generalized anxiety disorder: Secondary | ICD-10-CM

## 2022-06-18 DIAGNOSIS — Z23 Encounter for immunization: Secondary | ICD-10-CM

## 2022-06-18 DIAGNOSIS — E039 Hypothyroidism, unspecified: Secondary | ICD-10-CM | POA: Diagnosis not present

## 2022-06-18 DIAGNOSIS — Z1211 Encounter for screening for malignant neoplasm of colon: Secondary | ICD-10-CM

## 2022-06-18 DIAGNOSIS — G4700 Insomnia, unspecified: Secondary | ICD-10-CM | POA: Diagnosis not present

## 2022-06-18 MED ORDER — ALPRAZOLAM 0.5 MG PO TABS: ORAL_TABLET | ORAL | 0 refills | Status: DC

## 2022-06-18 MED ORDER — SERTRALINE HCL 50 MG PO TABS: 75.0000 mg | ORAL_TABLET | Freq: Every day | ORAL | 1 refills | Status: DC

## 2022-06-18 NOTE — Assessment & Plan Note (Signed)
Uncontrolled.  Never got the 10mg  of ambien but two 5mg 's did not help when she tried it.  Will try xanax 0.5mg  1/2 tab to 1 tablet by mouth at bedtime as needed for sleep.

## 2022-06-18 NOTE — Assessment & Plan Note (Addendum)
Lab Results  Component Value Date   TSH 1.38 12/14/2021   Clinically stable on synthroid. Obtain follow up TSH.

## 2022-06-18 NOTE — Progress Notes (Signed)
Subjective:   By signing my name below, I, [name], attest that this documentation has been prepared under the direction and in the presence of [place], NP 06/18/2022      Patient ID: Alice Thomas, female    DOB: 1970-12-16, 51 y.o.   MRN: [id number]  Chief Complaint  Patient presents with   Hypothyroidism    Here for follow up   Insomnia    Patient reports having trouble falling sleep   Anxiety    Here for follow up    HPI Patient is in today for office visit.  Thyroid: Her thyroid levels are normal. She is currently taking 150 Mcg of Synthroid. Lab Results  Component Value Date   TSH 1.38 12/14/2021   Insomnia: She reports she is not using the Ambien for insomnia. She reports the 5 Mg and 10 Mg dosage did not help her insomnia. She reports that she is tired and falls asleep for a while and then wakes up suddenly. She reports that she has been eating a lot due to insomnia, which has caused her to gain weight. She feels a little anxious during the day but anxiety is minimal during the evening. She reports that her hot flashes are minimal. She is interested in trying 0.5 mg of Xanax.  Immunization: She is interested in receiving her 2nd Shingles vaccine and the influenza vaccine during today's visit.  Colonoscopy: She reports she has not received colonoscopy.   Anxiety: She is still taking 50 mg of Sertraline for anxiety but due to external stressors would be interested in increasing her dosage.   Health Maintenance Due  Topic Date Due   COLONOSCOPY (Pts 45-69yrs Insurance coverage will need to be confirmed)  Never done   COVID-19 Vaccine (3 - Moderna series) 05/17/2020    Past Medical History:  Diagnosis Date   Allergy    seasonal   Anemia    History of chicken pox    childhood   History of kidney stones    Thyroid disease    hypothyroidism    Past Surgical History:  Procedure Laterality Date   ENDOMETRIAL BIOPSY     HERNIA REPAIR     77  months of age ? abdominal   INCISION AND DRAINAGE RETROPHYARYNGEAL ABCESS  1992 / 64   KNEE ARTHROSCOPY WITH ANTERIOR CRUCIATE LIGAMENT (ACL) REPAIR Right 2006   TONSILLECTOMY AND ADENOIDECTOMY     childhood   WISDOM TOOTH EXTRACTION  1992 / 28    Family History  Problem Relation Age of Onset   Crohn's disease Father    Alzheimer's disease Father    Parkinson's disease Father    Hypothyroidism Sister    Heart disease Paternal Grandfather    Breast cancer Paternal Aunt     Social History   Socioeconomic History   Marital status: Married    Spouse name: Not on file   Number of children: Not on file   Years of education: Not on file   Highest education level: Not on file  Occupational History   Not on file  Tobacco Use   Smoking status: Every Day    Packs/day: 0.50    Years: 10.00    Total pack years: 5.00    Types: Cigarettes   Smokeless tobacco: Never  Vaping Use   Vaping Use: Never used  Substance and Sexual Activity   Alcohol use: Yes    Alcohol/week: 0.0 - 4.0 standard drinks of alcohol   Drug use: No  Sexual activity: Yes    Partners: Male    Birth control/protection: I.U.D.  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 90 tablet 3   levothyroxine (SYNTHROID) 150 MCG tablet Take 1 tablet (150 mcg total) by mouth daily before breakfast. 90 tablet 1   hydrOXYzine (VISTARIL) 25 MG capsule  TAKE 1 CAPSULE (25 MG TOTAL) BY MOUTH AT BEDTIME AS NEEDED (INSOMNIA). 90 capsule 2   sertraline (ZOLOFT) 50 MG tablet Take 1 tablet (50 mg total) by mouth daily. 90 tablet 1   zolpidem (AMBIEN) 10 MG tablet Take 1 tablet (10 mg total) by mouth at bedtime as needed for sleep. 30 tablet 0   No facility-administered medications prior to visit.    No Known Allergies  Review of Systems  Psychiatric/Behavioral:  The patient has insomnia.        Objective:    Physical Exam Constitutional:      General: She is not in acute distress.    Appearance: Normal appearance. She is not ill-appearing.  HENT:     Head: Normocephalic and atraumatic.     Right Ear: External ear normal.     Left Ear: External ear normal.  Eyes:     Extraocular Movements: Extraocular movements intact.     Pupils: Pupils are equal, round, and reactive to light.  Neck:     Comments: Thyroid is mildly enlarged.  Cardiovascular:     Rate and Rhythm: Normal rate and regular rhythm.     Heart sounds: Normal heart sounds. No murmur heard.    No gallop.  Pulmonary:     Effort: Pulmonary effort is normal. No respiratory distress.     Breath sounds: Normal breath sounds. No wheezing or rales.  Skin:    General: Skin is warm and dry.  Neurological:     Mental Status: She is alert and oriented to person, place, and time.  Psychiatric:        Judgment: Judgment normal.     BP 123/83 (BP Location: Right Arm, Patient Position: Sitting, Cuff Size: Small)   Pulse 81   Temp 98.1 F (36.7 C) (Oral)   Resp 16   Wt 185 lb (83.9 kg)   SpO2 100%   BMI 32.26 kg/m  Wt Readings from Last 3 Encounters:  06/18/22 185 lb (83.9 kg)  01/16/22 163 lb (73.9 kg)  12/14/21 163 lb (73.9 kg)       Assessment & Plan:   Problem List Items Addressed This Visit       Unprioritized   Insomnia    Uncontrolled.  Never got the 10mg  of ambien but two 5mg 's did not help when she tried it.  Will try xanax 0.5mg  1/2 tab to 1 tablet by  mouth at bedtime as needed for sleep.       Hypothyroidism    Lab  Results  Component Value Date   TSH 1.38 12/14/2021  Clinically stable on synthroid. Obtain follow up TSH.        Relevant Orders   TSH   Anxiety state    Uncontrolled. Will increase zoloft from 50mg  to 75mg .       Relevant Medications   ALPRAZolam (XANAX) 0.5 MG tablet   sertraline (ZOLOFT) 50 MG tablet   Other Visit Diagnoses     Needs flu shot    -  Primary   Relevant Orders   Flu Vaccine QUAD 6+ mos PF IM (Fluarix Quad PF) (Completed)   Colon cancer screening       Relevant Orders   Ambulatory referral to Gastroenterology   Need for shingles vaccine       Relevant Orders   Zoster Recombinant (Shingrix ) (Completed)        Meds ordered this encounter  Medications   ALPRAZolam (XANAX) 0.5 MG tablet    Sig: 1/2-1 tablet by mouth once daily at bedtime as needed for sleep.    Dispense:  30 tablet    Refill:  0    Order Specific Question:   Supervising Provider    Answer:   Penni Homans A [[id number]]   sertraline (ZOLOFT) 50 MG tablet    Sig: Take 1.5 tablets (75 mg total) by mouth daily.    Dispense:  135 tablet    Refill:  1    Order Specific Question:   Supervising Provider    Answer:   Penni Homans A [[id number]]    I, Nance Pear, NP, personally preformed the services described in this documentation.  All medical record entries made by the scribe were at my direction and in my presence.  I have reviewed the chart and discharge instructions (if applicable) and agree that the record reflects my personal performance and is accurate and complete. @ENCDATE @   I,Amber Ashland as a Education administrator for [employer], NP.,have documented all relevant documentation on the behalf of Nance Pear, NP,as directed by  Nance Pear, NP while in the presence of Nance Pear, NP.    Nance Pear, NP

## 2022-06-18 NOTE — Assessment & Plan Note (Signed)
Uncontrolled. Will increase zoloft from 50mg  to 75mg .

## 2022-06-19 ENCOUNTER — Telehealth: Payer: Self-pay | Admitting: Family

## 2022-06-19 LAB — TSH: TSH: 4.22 mIU/L

## 2022-06-21 NOTE — Telephone Encounter (Signed)
Opened in error

## 2022-07-30 ENCOUNTER — Ambulatory Visit: Payer: 59 | Admitting: Family

## 2022-07-30 VITALS — BP 110/75 | HR 76 | Temp 98.2°F | Resp 16 | Wt 183.0 lb

## 2022-07-30 DIAGNOSIS — E039 Hypothyroidism, unspecified: Secondary | ICD-10-CM

## 2022-07-30 DIAGNOSIS — G4700 Insomnia, unspecified: Secondary | ICD-10-CM | POA: Diagnosis not present

## 2022-07-30 DIAGNOSIS — F411 Generalized anxiety disorder: Secondary | ICD-10-CM | POA: Diagnosis not present

## 2022-07-30 MED ORDER — TRAZODONE HCL 50 MG PO TABS: 25.0000 mg | ORAL_TABLET | Freq: Every evening | ORAL | 3 refills | Status: DC | PRN

## 2022-07-30 NOTE — Progress Notes (Signed)
Subjective:   By signing my name below, I, Alice Thomas, attest that this documentation has been prepared under the direction and in the presence of Axis, NP 07/30/2022   Patient ID: Alice Thomas, female    DOB: July 19, 1971, 51 y.o.   MRN: [id number]  Chief Complaint  Patient presents with   Insomnia    Here for follow up, "did not like xanax"   Anxiety    Here for follow up    HPI Patient is in today for an office visit   Anxiety: Since last visit, her Zoloft medication was increased from 50 mg to 75 mg. She states that overall she feels fine. She also states that overall she is an anxious person.   Insomnia: She states that the 0.5 mg of Xanax did not help her sleep but made her lethargic during the day. She states that the first night she took the medication, she was able to sleep well the first night on medication but contributes that to receiving the influenza vaccine that day. She has since discontinued the Xanax medication. However, her insomnia is consistent. During the night, she struggles to fall asleep due to shaking and heart racing. She has previously tried Hydroxyzine and Ambien. She is interested in trying Trazodone.   Arthritis: She complains of possible arthritis. She states that sometimes her fingers become stuck  Weight: She is planning to start [place] from Last 3 Encounters:  07/30/22 183 lb (83 kg)  06/18/22 185 lb (83.9 kg)  01/16/22 163 lb (73.9 kg)   Colonoscopy: She reports that she has a colonoscopy scheduled for December 1st.   Health Maintenance Due  Topic Date Due   COLONOSCOPY (Pts 45-66yrs Insurance coverage will need to be confirmed)  Never done   COVID-19 Vaccine (3 - Moderna series) 05/17/2020    Past Medical History:  Diagnosis Date   Allergy    seasonal   Anemia    History of chicken pox    childhood   History of kidney stones    Thyroid disease    hypothyroidism    Past Surgical History:  Procedure  Laterality Date   ENDOMETRIAL BIOPSY     HERNIA REPAIR     54 months of age ? abdominal   INCISION AND DRAINAGE RETROPHYARYNGEAL ABCESS  1992 / 93   KNEE ARTHROSCOPY WITH ANTERIOR CRUCIATE LIGAMENT (ACL) REPAIR Right 2006   TONSILLECTOMY AND ADENOIDECTOMY     childhood   WISDOM TOOTH EXTRACTION  1992 / 35    Family History  Problem Relation Age of Onset   Crohn's disease Father    Alzheimer's disease Father    Parkinson's disease Father    Hypothyroidism Sister    Heart disease Paternal Grandfather    Breast cancer Paternal Aunt     Social History   Socioeconomic History   Marital status: Married    Spouse name: Not on file   Number of children: Not on file   Years of education: Not on file   Highest education level: Not on file  Occupational History   Not on file  Tobacco Use   Smoking status: Every Day    Packs/day: 0.50    Years: 10.00    Total pack years: 5.00    Types: Cigarettes   Smokeless tobacco: Never  Vaping Use   Vaping Use: Never used  Substance and Sexual Activity   Alcohol use: Yes    Alcohol/week: 0.0 - 4.0 standard drinks of  alcohol   Drug use: No   Sexual activity: Yes    Partners: Male    Birth control/protection: I.U.D.  Other Topics Concern   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[facility]   Married (second marriage)   No children   No pets   Enjoys volunteering and exercise but has not being doing    Social Determinants of Corporate investment banker Strain: Not on file  Food Insecurity: Not on file  Transportation Needs: Not on file  Physical Activity: Not on file  Stress: Not on file  Social Connections: Not on file  Intimate Partner Violence: Not on file    Outpatient Medications Prior to Visit  Medication Sig Dispense Refill   Ibuprofen-Famotidine 800-26.6 MG TABS Take 1 tablet by mouth 3 (three) times daily. 90 tablet 3   levothyroxine (SYNTHROID) 150 MCG tablet Take 1 tablet (150 mcg total) by mouth daily before  breakfast. 90 tablet 1   sertraline (ZOLOFT) 50 MG tablet Take 1.5 tablets (75 mg total) by mouth daily. 135 tablet 1   ALPRAZolam (XANAX) 0.5 MG tablet 1/2-1 tablet by mouth once daily at bedtime as needed for sleep. 30 tablet 0   No facility-administered medications prior to visit.    No Known Allergies  ROS See HPI    Objective:    Physical Exam Constitutional:      General: She is not in acute distress.    Appearance: Normal appearance. She is not ill-appearing.  HENT:     Head: Normocephalic and atraumatic.     Right Ear: External ear normal.     Left Ear: External ear normal.  Eyes:     Extraocular Movements: Extraocular movements intact.     Pupils: Pupils are equal, round, and reactive to light.  Cardiovascular:     Rate and Rhythm: Normal rate and regular rhythm.     Heart sounds: Normal heart sounds. No murmur heard.    No gallop.  Pulmonary:     Effort: Pulmonary effort is normal. No respiratory distress.     Breath sounds: Normal breath sounds. No wheezing or rales.  Skin:    General: Skin is warm and dry.  Neurological:     Mental Status: She is alert and oriented to person, place, and time.  Psychiatric:        Mood and Affect: Mood normal.        Behavior: Behavior normal.        Judgment: Judgment normal.     BP 110/75 (BP Location: Left Arm, Patient Position: Sitting) Comment: Manual  Pulse 76   Temp 98.2 F (36.8 C) (Oral)   Resp 16   Wt 183 lb (83 kg)   SpO2 99%   BMI 31.91 kg/m  Wt Readings from Last 3 Encounters:  07/30/22 183 lb (83 kg)  06/18/22 185 lb (83.9 kg)  01/16/22 163 lb (73.9 kg)       Assessment & Plan:   Problem List Items Addressed This Visit       Unprioritized   Insomnia - Primary    No improvement with xanax.  Has not tried trazodone. Will try. If no improvement next step would be lunesta.       Hypothyroidism    Lab Results  Component Value Date   TSH 4.22 06/18/2022  TSH WNL. Continue current dose of  synthroid.       Anxiety state    Some improvement with increased dose  of zoloft. Continue same. Did not like xanax. D/c xanax.       Relevant Medications   traZODone (DESYREL) 50 MG tablet   Meds ordered this encounter  Medications   traZODone (DESYREL) 50 MG tablet    Sig: Take 0.5-1 tablets (25-50 mg total) by mouth at bedtime as needed for sleep.    Dispense:  30 tablet    Refill:  3    Order Specific Question:   Supervising Provider    Answer:   Danise Edge A [[id number]]    I, Lemont Fillers, NP, personally preformed the services described in this documentation.  All medical record entries made by the scribe were at my direction and in my presence.  I have reviewed the chart and discharge instructions (if applicable) and agree that the record reflects my personal performance and is accurate and complete. 07/30/2022   I,Amber Collins,acting as a scribe for Lemont Fillers, NP.,have documented all relevant documentation on the behalf of Lemont Fillers, NP,as directed by  Lemont Fillers, NP while in the presence of Lemont Fillers, NP.    Lemont Fillers, NP

## 2022-07-30 NOTE — Assessment & Plan Note (Signed)
Lab Results  Component Value Date   TSH 4.22 06/18/2022   TSH WNL. Continue current dose of synthroid.

## 2022-07-30 NOTE — Assessment & Plan Note (Signed)
Some improvement with increased dose of zoloft. Continue same. Did not like xanax. D/c xanax.

## 2022-07-30 NOTE — Assessment & Plan Note (Signed)
No improvement with xanax.  Has not tried trazodone. Will try. If no improvement next step would be lunesta.

## 2022-08-30 ENCOUNTER — Other Ambulatory Visit: Payer: Self-pay | Admitting: Family

## 2022-10-09 ENCOUNTER — Ambulatory Visit (INDEPENDENT_AMBULATORY_CARE_PROVIDER_SITE_OTHER): Payer: 59 | Admitting: Family Medicine

## 2022-10-09 ENCOUNTER — Ambulatory Visit: Payer: Self-pay

## 2022-10-09 ENCOUNTER — Encounter: Payer: Self-pay | Admitting: Family Medicine

## 2022-10-09 ENCOUNTER — Ambulatory Visit (HOSPITAL_BASED_OUTPATIENT_CLINIC_OR_DEPARTMENT_OTHER)
Admission: RE | Admit: 2022-10-09 | Discharge: 2022-10-09 | Disposition: A | Discharge status: Home / Self Care | Payer: 59 | Source: Ambulatory Visit | Attending: Family Medicine | Admitting: Family Medicine

## 2022-10-09 VITALS — BP 136/88 | Ht 63.5 in | Wt 182.8 lb

## 2022-10-09 DIAGNOSIS — M23203 Derangement of unspecified medial meniscus due to old tear or injury, right knee: Secondary | ICD-10-CM

## 2022-10-09 MED ORDER — TRIAMCINOLONE ACETONIDE 40 MG/ML IJ SUSP
40.0000 mg | Freq: Once | INTRAMUSCULAR | Status: AC
  Administered 2022-10-09: 40 mg via INTRA_ARTICULAR

## 2022-10-09 NOTE — Assessment & Plan Note (Signed)
Acute on chronic in nature.  Continues to have medial sided pain with a meniscal cyst appreciated on exam.  Does have a history of surgery from 20 years ago.  Pain is acutely gotten worse.  Has been under greater than 6 weeks of physician directed home exercise therapy. -Counseled On home exercise therapy and supportive care. -Injection. -X-ray. -MRI of the right knee to evaluate for internal derangement and for presurgical planning.

## 2022-10-09 NOTE — Progress Notes (Signed)
  Alice Thomas - 52 y.o. female MRN [id number]  Date of birth: March 16, 1971  SUBJECTIVE:  Including CC & ROS.  No chief complaint on file.   Alice Thomas is a 52 y.o. female that is  presenting with acute on chronic right knee pain. Knee pain has acutely gotten worse. Has a history of her right knee from 20 years ago. Did well with previous injection. Now again having medial sided pain.    Review of Systems See HPI   HISTORY: Past Medical, Surgical, Social, and Family History Reviewed & Updated per EMR.   Pertinent Historical Findings include:  Past Medical History:  Diagnosis Date   Allergy    seasonal   Anemia    History of chicken pox    childhood   History of kidney stones    Thyroid disease    hypothyroidism    Past Surgical History:  Procedure Laterality Date   ENDOMETRIAL BIOPSY     HERNIA REPAIR     41 months of age ? abdominal   INCISION AND DRAINAGE RETROPHYARYNGEAL ABCESS  1992 / 2   KNEE ARTHROSCOPY WITH ANTERIOR CRUCIATE LIGAMENT (ACL) REPAIR Right 2006   TONSILLECTOMY AND ADENOIDECTOMY     childhood   WISDOM TOOTH EXTRACTION  1992 / 89     PHYSICAL EXAM:  VS: BP 136/88   Ht 5' 3.5" (1.613 m)   Wt 182 lb 12.8 oz (82.9 kg)   BMI 31.87 kg/m  Physical Exam Gen: NAD, alert, cooperative with exam, well-appearing MSK:  Right knee:  Effusion noted  Instability with valgus and varus testing  Positive McMurray's testing  Neurovascularly intact    Limited ultrasound: right knee pain:  Effusion noted  Normal-appearing quadricep and patellar tendon. Meniscal cyst appreciated over the meniscal joint space with degenerative changes of the medial meniscus. Mild degenerative changes in the lateral meniscus   Summary: Findings consistent with degenerative changes of the medial meniscus  Ultrasound and interpretation by Clearance Coots, MD   Aspiration/Injection Procedure Note Alice Thomas 1971-05-30  Procedure: Injection Indications: right knee  pain  Procedure Details Consent: Risks of procedure as well as the alternatives and risks of each were explained to the (patient/caregiver).  Consent for procedure obtained. Time Out: Verified patient identification, verified procedure, site/side was marked, verified correct patient position, special equipment/implants available, medications/allergies/relevent history reviewed, required imaging and test results available.  Performed.  The area was cleaned with iodine and alcohol swabs.    The right knee superior lateral suprapatellar pouch was injected using 3 cc of 1% lidocaine on a 22-gauge 1-1/2 inch needle.  The syringe was switched to mixture containing 1 cc's of 40 mg Kenalog and 4 cc's of 0.25% bupivacaine was injected.  Ultrasound was used. Images were obtained in long views showing the injection.     A sterile dressing was applied.  Patient did tolerate procedure well.   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  Continues to have medial sided pain with a meniscal cyst appreciated on exam.  Does have a history of surgery from 20 years ago.  Pain is acutely gotten worse.  Has been under greater than 6 weeks of physician directed home exercise therapy. -Counseled On home exercise therapy and supportive care. -Injection. -X-ray. -MRI of the right knee to evaluate for internal derangement and for presurgical planning.

## 2022-10-09 NOTE — Patient Instructions (Signed)
Good to see you Please alternate heat and ice  Please try the exercises  Please use a brace with activities  We'll call with the xray results.  We'll get the MRi at [facility] imaging   Please send me a message in [place] with any questions or updates.  We'll setup a virtual visit once the MRi is resulted.   --Dr. Raeford Razor

## 2022-10-11 ENCOUNTER — Telehealth: Payer: Self-pay | Admitting: Family Medicine

## 2022-10-11 NOTE — Telephone Encounter (Signed)
Left VM for patient. If she calls back please have her speak with a nurse/CMA and inform that her xrays are reassuring.   If any questions then please take the best time and phone number to call and I will try to call her back.   Rosemarie Ax, MD [facility] 10/11/2022, 9:50 AM

## 2022-10-16 NOTE — Telephone Encounter (Signed)
Pt informed of below.  

## 2022-10-22 ENCOUNTER — Encounter: Payer: Self-pay | Admitting: Family

## 2022-10-22 ENCOUNTER — Ambulatory Visit: Payer: 59 | Admitting: Family

## 2022-10-22 VITALS — BP 123/73 | HR 75 | Temp 97.8°F | Resp 16 | Wt 184.0 lb

## 2022-10-22 DIAGNOSIS — R590 Localized enlarged lymph nodes: Secondary | ICD-10-CM

## 2022-10-22 NOTE — Progress Notes (Signed)
Subjective:     Patient ID: Alice Thomas, female    DOB: 12-31-1970, 52 y.o.   MRN: [id number]  Chief Complaint  Patient presents with   Neck Pain    Complains of pain on left side of neck    HPI Patient is in today for left sided neck tenderness. She is accompanied by her husband. Started Thursday night while sleeping. Denies fever/sore throat. Does have mild cough.  Denies ear pain, dental pain.    Health Maintenance Due  Topic Date Due   COLONOSCOPY (Pts 45-65yrs Insurance coverage will need to be confirmed)  Never done   COVID-19 Vaccine (3 - 2023-24 season) 05/24/2022    Past Medical History:  Diagnosis Date   Allergy    seasonal   Anemia    History of chicken pox    childhood   History of kidney stones    Thyroid disease    hypothyroidism    Past Surgical History:  Procedure Laterality Date   ENDOMETRIAL BIOPSY     HERNIA REPAIR     38 months of age ? abdominal   INCISION AND DRAINAGE RETROPHYARYNGEAL ABCESS  1992 / 35   KNEE ARTHROSCOPY WITH ANTERIOR CRUCIATE LIGAMENT (ACL) REPAIR Right 09/23/2004   TONSILLECTOMY AND ADENOIDECTOMY     childhood   WISDOM TOOTH EXTRACTION  1992 / 89   this was complicated by a post operative abscess which required hospitalization/ICU care x 9 days- during college    Family History  Problem Relation Age of Onset   Crohn's disease Father    Alzheimer's disease Father    Parkinson's disease Father    Hypothyroidism Sister    Heart disease Paternal Grandfather    Breast cancer Paternal Aunt     Social History   Socioeconomic History   Marital status: Married    Spouse name: Not on file   Number of children: Not on file   Years of education: Not on file   Highest education level: Not on file  Occupational History   Not on file  Tobacco Use   Smoking status: Every Day    Packs/day: 0.50    Years: 10.00    Total pack years: 5.00    Types: Cigarettes   Smokeless tobacco: Never  Vaping Use   Vaping Use: Never  used  Substance and Sexual Activity   Alcohol use: Yes    Alcohol/week: 0.0 - 4.0 standard drinks of alcohol   Drug use: No   Sexual activity: Yes    Partners: Male    Birth control/protection: I.U.D.  Other Topics Concern   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[facility]   Married (second marriage)   No children   No pets   Enjoys volunteering and exercise but has not being doing    Social Determinants of Radio broadcast assistant Strain: Not on file  Food Insecurity: Not on file  Transportation Needs: Not on file  Physical Activity: Not on file  Stress: Not on file  Social Connections: Not on file  Intimate Partner Violence: Not on file    Outpatient Medications Prior to Visit  Medication Sig Dispense Refill   levothyroxine (SYNTHROID) 150 MCG tablet Take 1 tablet (150 mcg total) by mouth daily before breakfast. 90 tablet 1   sertraline (ZOLOFT) 50 MG tablet Take 1.5 tablets (75 mg total) by mouth daily. 135 tablet 1   Ibuprofen-Famotidine 800-26.6 MG TABS Take 1 tablet by mouth 3 (three)  times daily. 90 tablet 3   traZODone (DESYREL) 50 MG tablet Take 0.5-1 tablets (25-50 mg total) by mouth at bedtime as needed for sleep. 90 tablet 0   No facility-administered medications prior to visit.    No Known Allergies  ROS     Objective:    Physical Exam Constitutional:      Appearance: Normal appearance.  HENT:     Head: Normocephalic and atraumatic.     Right Ear: Tympanic membrane and ear canal normal.     Left Ear: Tympanic membrane and ear canal normal.  Neck:     Comments: Palpable tender lymph node noted in the left upper anterior cervical chain. Approximately 1.5-2 cm in size. Musculoskeletal:     Cervical back: Neck supple.  Neurological:     Mental Status: She is alert.     BP 123/73 (BP Location: Right Arm, Patient Position: Sitting, Cuff Size: Small)   Pulse 75   Temp 97.8 F (36.6 C) (Oral)   Resp 16   Wt 184 lb (83.5 kg)   SpO2 100%    BMI 32.08 kg/m  Wt Readings from Last 3 Encounters:  10/22/22 184 lb (83.5 kg)  10/09/22 182 lb 12.8 oz (82.9 kg)  07/30/22 183 lb (83 kg)       Assessment & Plan:   Problem List Items Addressed This Visit       Unprioritized   Enlarged lymph node in neck - Primary    New. Tender. Has a mild cough.  I suspect she is fighting a viral illness and that this will resolve on its own.  I have advised her to follow up in 2 weeks for re-examination.  Call if increased pain/swelling in the meantime. If no improvement in 2 weeks, will plan [place] for further evaluation.        I have discontinued Shauntelle Hockenbury's Ibuprofen-Famotidine and traZODone. I am also having her maintain her levothyroxine and sertraline.  No orders of the defined types were placed in this encounter.

## 2022-10-22 NOTE — Assessment & Plan Note (Signed)
New. Tender. Has a mild cough.  I suspect she is fighting a viral illness and that this will resolve on its own.  I have advised her to follow up in 2 weeks for re-examination.  Call if increased pain/swelling in the meantime. If no improvement in 2 weeks, will plan [place] for further evaluation.

## 2022-11-05 ENCOUNTER — Ambulatory Visit: Payer: 59 | Admitting: Family

## 2022-11-23 ENCOUNTER — Other Ambulatory Visit: Payer: Self-pay | Admitting: Family

## 2023-01-06 ENCOUNTER — Encounter: Payer: Self-pay | Admitting: *Deleted

## 2023-01-28 ENCOUNTER — Encounter: Payer: Self-pay | Admitting: Family Medicine

## 2023-01-28 ENCOUNTER — Telehealth: Payer: Self-pay | Admitting: Family Medicine

## 2023-01-28 ENCOUNTER — Other Ambulatory Visit: Payer: Self-pay | Admitting: Family Medicine

## 2023-01-28 ENCOUNTER — Ambulatory Visit: Payer: 59 | Admitting: Family Medicine

## 2023-01-28 ENCOUNTER — Ambulatory Visit (HOSPITAL_BASED_OUTPATIENT_CLINIC_OR_DEPARTMENT_OTHER)
Admission: RE | Admit: 2023-01-28 | Discharge: 2023-01-28 | Disposition: A | Discharge status: Home / Self Care | Payer: 59 | Source: Ambulatory Visit | Attending: Family Medicine | Admitting: Family Medicine

## 2023-01-28 VITALS — BP 104/70 | HR 104 | Temp 98.5°F | Resp 18 | Ht 63.5 in | Wt 190.8 lb

## 2023-01-28 DIAGNOSIS — R42 Dizziness and giddiness: Secondary | ICD-10-CM | POA: Insufficient documentation

## 2023-01-28 DIAGNOSIS — R051 Acute cough: Secondary | ICD-10-CM

## 2023-01-28 DIAGNOSIS — G4700 Insomnia, unspecified: Secondary | ICD-10-CM

## 2023-01-28 DIAGNOSIS — Z720 Tobacco use: Secondary | ICD-10-CM

## 2023-01-28 DIAGNOSIS — R0683 Snoring: Secondary | ICD-10-CM

## 2023-01-28 MED ORDER — TRELEGY ELLIPTA 100-62.5-25 MCG/ACT IN AEPB: 1.0000 | INHALATION_SPRAY | Freq: Every day | RESPIRATORY_TRACT | 11 refills | Status: DC

## 2023-01-28 MED ORDER — ALBUTEROL SULFATE HFA 108 (90 BASE) MCG/ACT IN AERS
2.0000 | INHALATION_SPRAY | Freq: Four times a day (QID) | RESPIRATORY_TRACT | 2 refills | Status: DC | PRN
Start: 2023-01-28 — End: 2023-01-29

## 2023-01-28 NOTE — Patient Instructions (Signed)
How to Perform the Epley Maneuver The Epley maneuver is an exercise that relieves symptoms of vertigo. Vertigo is the feeling that you or your surroundings are moving when they are not. When you feel vertigo, you may feel like the room is spinning and may have trouble walking. The Epley maneuver is used for a type of vertigo caused by a calcium deposit in a part of the inner ear. The maneuver involves changing head positions to help the deposit move out of the area. You can do this maneuver at home whenever you have symptoms of vertigo. You can repeat it in 24 hours if your vertigo has not gone away. Even though the Epley maneuver may relieve your vertigo for a few weeks, it is possible that your symptoms will return. This maneuver relieves vertigo, but it does not relieve dizziness. What are the risks? If it is done correctly, the Epley maneuver is considered safe. Sometimes it can lead to dizziness or nausea that goes away after a short time. If you develop other symptoms--such as changes in vision, weakness, or numbness--stop doing the maneuver and call your health care provider. Supplies needed: A bed or table. A pillow. How to do the Epley maneuver     Sit on the edge of a bed or table with your back straight and your legs extended or hanging over the edge of the bed or table. Turn your head halfway toward the affected ear or side as told by your health care provider. Lie backward quickly with your head turned until you are lying flat on your back. Your head should dangle (head-hanging position). You may want to position a pillow under your shoulders. Hold this position for at least 30 seconds. If you feel dizzy or have symptoms of vertigo, continue to hold the position until the symptoms stop. Turn your head to the opposite direction until your unaffected ear is facing down. Your head should continue to dangle. Hold this position for at least 30 seconds. If you feel dizzy or have symptoms of  vertigo, continue to hold the position until the symptoms stop. Turn your whole body to the same side as your head so that you are positioned on your side. Your head will now be nearly facedown and no longer needs to dangle. Hold for at least 30 seconds. If you feel dizzy or have symptoms of vertigo, continue to hold the position until the symptoms stop. Sit back up. You can repeat the maneuver in 24 hours if your vertigo does not go away. Follow these instructions at home: For 24 hours after doing the Epley maneuver: Keep your head in an upright position. When lying down to sleep or rest, keep your head raised (elevated) with two or more pillows. Avoid excessive neck movements. Activity Do not drive or use machinery if you feel dizzy. After doing the Epley maneuver, return to your normal activities as told by your health care provider. Ask your health care provider what activities are safe for you. General instructions Drink enough fluid to keep your urine pale yellow. Do not drink alcohol. Take over-the-counter and prescription medicines only as told by your health care provider. Keep all follow-up visits. This is important. Preventing vertigo symptoms Ask your health care provider if there is anything you should do at home to prevent vertigo. He or she may recommend that you: Keep your head elevated with two or more pillows while you sleep. Do not sleep on the side of your affected ear. Get   up slowly from bed. Avoid sudden movements during the day. Avoid extreme head positions or movement, such as looking up or bending over. Contact a health care provider if: Your vertigo gets worse. You have other symptoms, including: Nausea. Vomiting. Headache. Get help right away if you: Have vision changes. Have a headache or neck pain that is severe or getting worse. Cannot stop vomiting. Have new numbness or weakness in any part of your body. These symptoms may represent a serious problem  that is an emergency. Do not wait to see if the symptoms will go away. Get medical help right away. Call your local emergency services (911 in the [place] Do not drive yourself to the hospital. Summary Vertigo is the feeling that you or your surroundings are moving when they are not. The Epley maneuver is an exercise that relieves symptoms of vertigo. If the Epley maneuver is done correctly, it is considered safe. This information is not intended to replace advice given to you by your health care provider. Make sure you discuss any questions you have with your health care provider. Document Revised: 08/09/2020 Document Reviewed: 08/09/2020 Elsevier Patient Education  2023 [employer]  

## 2023-01-28 NOTE — Progress Notes (Addendum)
Subjective:   By signing my name below, I, Shehryar Baig, attest that this documentation has been prepared under the direction and in the presence of Donato Schultz, DO. 01/28/2023   Patient ID: Alice Thomas, female    DOB: [birth year], 52 y.o.   MRN: [id number]  Chief Complaint  Patient presents with   Dizziness    X2 days, pt states having dizziness, sleepy more than normal, no SOB or chest pain.     Dizziness Associated symptoms include coughing. Pertinent negatives include no abdominal pain, chest pain, congestion, fever, headaches, nausea, rash or vomiting.   Patient is in today for a office visit.   She complains of constant dizziness for the past 2 days. She is also experiencing a dry cough for the past 3 months that is worse at night. Her cough mostly doesn't bother her. She has taken CBD recently to help her sleep at night. She is feeling more sleepy than normal. She denies shortness of breathe, vision changes, ear fullness or chest pain. She's had vertigo in the past but feels her symptoms feel different. She continues taking 150 mcg synthroid daily PO and reports no new issues while taking it. She continues taking 75 mg Sertraline daily PO and reports no new issues while taking it. She currently has an IUD in place and does not have menstrual cycles since having it. She continues smoking daily.  Her blood pressure is low during this visit. She denies heart palpitations, shortness of breathe or chest pains.  BP Readings from Last 3 Encounters:  01/28/23 104/70  10/22/22 123/73  10/09/22 136/88   Pulse Readings from Last 3 Encounters:  01/28/23 (!) 104  10/22/22 75  07/30/22 76    Past Medical History:  Diagnosis Date   Allergy    seasonal   Anemia    History of chicken pox    childhood   History of kidney stones    Thyroid disease    hypothyroidism    Past Surgical History:  Procedure Laterality Date   ENDOMETRIAL BIOPSY     HERNIA REPAIR     6 months of  age ? abdominal   INCISION AND DRAINAGE RETROPHYARYNGEAL ABCESS  1992 / 93   KNEE ARTHROSCOPY WITH ANTERIOR CRUCIATE LIGAMENT (ACL) REPAIR Right 09/23/2004   TONSILLECTOMY AND ADENOIDECTOMY     childhood   WISDOM TOOTH EXTRACTION  1992 / 93   this was complicated by a post operative abscess which required hospitalization/ICU care x 9 days- during college    Family History  Problem Relation Age of Onset   Crohn's disease Father    Alzheimer's disease Father    Parkinson's disease Father    Hypothyroidism Sister    Heart disease Paternal Grandfather    Breast cancer Paternal Aunt     Social History   Socioeconomic History   Marital status: Married    Spouse name: Not on file   Number of children: Not on file   Years of education: Not on file   Highest education level: Not on file  Occupational History   Not on file  Tobacco Use   Smoking status: Every Day    Packs/day: 0.50    Years: 10.00    Additional pack years: 0.00    Total pack years: 5.00    Types: Cigarettes   Smokeless tobacco: Never  Vaping Use   Vaping Use: Never used  Substance and Sexual Activity   Alcohol use: Yes    Alcohol/week:  0.0 - 4.0 standard drinks of alcohol   Drug use: No   Sexual activity: Yes    Partners: Male    Birth control/protection: I.U.D.  Other Topics Concern   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[employer]   Married (second marriage)   No children   No pets   Enjoys volunteering and exercise but has not being doing    Social Determinants of Corporate investment banker Strain: Not on file  Food Insecurity: Not on file  Transportation Needs: Not on file  Physical Activity: Not on file  Stress: Not on file  Social Connections: Not on file  Intimate Partner Violence: Not on file    Outpatient Medications Prior to Visit  Medication Sig Dispense Refill   levothyroxine (SYNTHROID) 150 MCG tablet TAKE 1 TABLET BY MOUTH DAILY BEFORE BREAKFAST. 90 tablet 1   sertraline  (ZOLOFT) 50 MG tablet Take 1.5 tablets (75 mg total) by mouth daily. 135 tablet 1   No facility-administered medications prior to visit.    No Known Allergies  Review of Systems  Constitutional:  Negative for fever and malaise/fatigue.  HENT:  Negative for congestion.        (-)ear fullness  Eyes:  Negative for blurred vision.       (-)vision changes  Respiratory:  Positive for cough. Negative for shortness of breath.   Cardiovascular:  Negative for chest pain, palpitations and leg swelling.  Gastrointestinal:  Negative for abdominal pain, blood in stool, nausea and vomiting.  Genitourinary:  Negative for dysuria and frequency.  Musculoskeletal: Negative.  Negative for back pain and falls.  Skin:  Negative for rash.  Neurological:  Positive for dizziness. Negative for loss of consciousness and headaches.  Endo/Heme/Allergies:  Negative for environmental allergies.  Psychiatric/Behavioral:  Negative for depression. The patient is not nervous/anxious.        Objective:    Physical Exam Vitals and nursing note reviewed.  Constitutional:      General: She is not in acute distress.    Appearance: Normal appearance. She is not ill-appearing.  HENT:     Head: Normocephalic and atraumatic.     Right Ear: Tympanic membrane, ear canal and external ear normal.     Left Ear: Tympanic membrane, ear canal and external ear normal.     Nose: Nose normal. No congestion or rhinorrhea.  Eyes:     Extraocular Movements: Extraocular movements intact.     Right eye: Normal extraocular motion and no nystagmus.     Left eye: Normal extraocular motion and no nystagmus.     Pupils: Pupils are equal, round, and reactive to light.  Cardiovascular:     Rate and Rhythm: Normal rate and regular rhythm.     Heart sounds: Normal heart sounds. No murmur heard.    No gallop.  Pulmonary:     Effort: Pulmonary effort is normal. No respiratory distress.     Breath sounds: Wheezing (expiratory wheezes  bilaterally) present. No rales.  Abdominal:     General: Abdomen is flat.     Palpations: Abdomen is soft.     Tenderness: There is no guarding or rebound.  Musculoskeletal:     Cervical back: Normal range of motion and neck supple.  Skin:    General: Skin is warm and dry.     Findings: No erythema or lesion.  Neurological:     General: No focal deficit present.     Mental Status: She is  alert and oriented to person, place, and time.     Gait: Gait normal.     Deep Tendon Reflexes: Reflexes normal.  Psychiatric:        Judgment: Judgment normal.     BP 104/70 (BP Location: Left Arm, Patient Position: Sitting, Cuff Size: Large)   Pulse (!) 104   Temp 98.5 F (36.9 C) (Oral)   Resp 18   Ht 5' 3.5" (1.613 m)   Wt 190 lb 12.8 oz (86.5 kg)   SpO2 97%   BMI 33.27 kg/m  Wt Readings from Last 3 Encounters:  01/28/23 190 lb 12.8 oz (86.5 kg)  10/22/22 184 lb (83.5 kg)  10/09/22 182 lb 12.8 oz (82.9 kg)       Assessment & Plan:  Dizziness -     CBC with Differential/Platelet -     Comprehensive metabolic panel -     Thyroid Panel With TSH -     Vitamin B12 -     VITAMIN D 25 Hydroxy (Vit-D Deficiency, Fractures)  Acute cough -     DG Chest 2 View; Future -     EKG 12-Lead -     Trelegy Ellipta; Inhale 1 puff into the lungs daily.  Dispense: 1 each; Refill: 11 -     Albuterol Sulfate HFA; Inhale 2 puffs into the lungs every 6 (six) hours as needed for wheezing or shortness of breath.  Dispense: 8 g; Refill: 2  Tobacco abuse -     Trelegy Ellipta; Inhale 1 puff into the lungs daily.  Dispense: 1 each; Refill: 11 -     Albuterol Sulfate HFA; Inhale 2 puffs into the lungs every 6 (six) hours as needed for wheezing or shortness of breath.  Dispense: 8 g; Refill: 2  Snoring -     Ambulatory referral to Neurology  Insomnia, unspecified type -     Ambulatory referral to Neurology    I, Donato Schultz, DO, personally preformed the services described in this  documentation.  All medical record entries made by the scribe were at my direction and in my presence.  I have reviewed the chart and discharge instructions (if applicable) and agree that the record reflects my personal performance and is accurate and complete. 01/28/2023   I,Shehryar Baig,acting as a scribe for Donato Schultz, DO.,have documented all relevant documentation on the behalf of Donato Schultz, DO,as directed by  Donato Schultz, DO while in the presence of Donato Schultz, DO.   Donato Schultz, DO

## 2023-01-29 ENCOUNTER — Other Ambulatory Visit: Payer: Self-pay

## 2023-01-29 ENCOUNTER — Other Ambulatory Visit: Payer: Self-pay | Admitting: Family Medicine

## 2023-01-29 DIAGNOSIS — R051 Acute cough: Secondary | ICD-10-CM

## 2023-01-29 DIAGNOSIS — Z720 Tobacco use: Secondary | ICD-10-CM

## 2023-01-29 LAB — CBC WITH DIFFERENTIAL/PLATELET
Absolute Monocytes: 328 cells/uL (ref 200–950)
Basophils Absolute: 27 cells/uL (ref 0–200)
Basophils Relative: 0.4 %
Eosinophils Absolute: 7 cells/uL — ABNORMAL LOW (ref 15–500)
Eosinophils Relative: 0.1 %
HCT: 42.2 % (ref 35.0–45.0)
Hemoglobin: 14.1 g/dL (ref 11.7–15.5)
Lymphs Abs: 1648 cells/uL (ref 850–3900)
MCH: 29.5 pg (ref 27.0–33.0)
MCHC: 33.4 g/dL (ref 32.0–36.0)
MCV: 88.3 fL (ref 80.0–100.0)
MPV: 9.9 fL (ref 7.5–12.5)
Monocytes Relative: 4.9 %
Neutro Abs: 4690 cells/uL (ref 1500–7800)
Neutrophils Relative %: 70 %
Platelets: 281 10*3/uL (ref 140–400)
RBC: 4.78 10*6/uL (ref 3.80–5.10)
RDW: 12.5 % (ref 11.0–15.0)
Total Lymphocyte: 24.6 %
WBC: 6.7 10*3/uL (ref 3.8–10.8)

## 2023-01-29 LAB — COMPREHENSIVE METABOLIC PANEL
AG Ratio: 1.8 (calc) (ref 1.0–2.5)
ALT: 11 U/L (ref 6–29)
AST: 12 U/L (ref 10–35)
Albumin: 4.4 g/dL (ref 3.6–5.1)
Alkaline phosphatase (APISO): 53 U/L (ref 37–153)
BUN: 13 mg/dL (ref 7–25)
CO2: 24 mmol/L (ref 20–32)
Calcium: 10.2 mg/dL (ref 8.6–10.4)
Chloride: 105 mmol/L (ref 98–110)
Creat: 1.02 mg/dL (ref 0.50–1.03)
Globulin: 2.5 g/dL (calc) (ref 1.9–3.7)
Glucose, Bld: 104 mg/dL — ABNORMAL HIGH (ref 65–99)
Potassium: 4.4 mmol/L (ref 3.5–5.3)
Sodium: 141 mmol/L (ref 135–146)
Total Bilirubin: 0.3 mg/dL (ref 0.2–1.2)
Total Protein: 6.9 g/dL (ref 6.1–8.1)

## 2023-01-29 LAB — THYROID PANEL WITH TSH
Free Thyroxine Index: 2.8 (ref 1.4–3.8)
T3 Uptake: 34 % (ref 22–35)
T4, Total: 8.1 ug/dL (ref 5.1–11.9)
TSH: 2.06 mIU/L

## 2023-01-29 LAB — VITAMIN B12: Vitamin B-12: 577 pg/mL (ref 200–1100)

## 2023-01-29 LAB — VITAMIN D 25 HYDROXY (VIT D DEFICIENCY, FRACTURES): Vit D, 25-Hydroxy: 29 ng/mL — ABNORMAL LOW (ref 30–100)

## 2023-01-29 MED ORDER — VITAMIN D (ERGOCALCIFEROL) 1.25 MG (50000 UNIT) PO CAPS: 50000.0000 [IU] | ORAL_CAPSULE | ORAL | 1 refills | Status: DC

## 2023-01-29 MED ORDER — UMECLIDINIUM-VILANTEROL 62.5-25 MCG/ACT IN AEPB: 1.0000 | INHALATION_SPRAY | Freq: Every day | RESPIRATORY_TRACT | 5 refills | Status: DC

## 2023-01-29 NOTE — Telephone Encounter (Signed)
PA denied.   Formulary alternative(s) are Anoro Ellipta, tiotropium bromide/Spiriva Respimat, Bevespi, Wixela Inhub, fluticasone propionate-salmeterol diskus (except excluded NDCs), Mikael Spray, Symbicort

## 2023-01-29 NOTE — Telephone Encounter (Signed)
PA initiated via Covermymeds; KEY: [id number]. Awaiting determination.

## 2023-01-31 ENCOUNTER — Other Ambulatory Visit: Payer: Self-pay | Admitting: Family

## 2023-01-31 DIAGNOSIS — R051 Acute cough: Secondary | ICD-10-CM

## 2023-01-31 DIAGNOSIS — Z720 Tobacco use: Secondary | ICD-10-CM

## 2023-01-31 MED ORDER — ALBUTEROL SULFATE HFA 108 (90 BASE) MCG/ACT IN AERS: 2.0000 | INHALATION_SPRAY | Freq: Four times a day (QID) | RESPIRATORY_TRACT | 2 refills | Status: DC | PRN

## 2023-01-31 NOTE — Telephone Encounter (Signed)
Pharmacy comment:   Product Backordered/Unavailable: NOT AVAILABLE.

## 2023-02-03 ENCOUNTER — Other Ambulatory Visit: Payer: Self-pay | Admitting: Family

## 2023-02-03 DIAGNOSIS — Z720 Tobacco use: Secondary | ICD-10-CM

## 2023-02-03 DIAGNOSIS — R051 Acute cough: Secondary | ICD-10-CM

## 2023-02-09 NOTE — Assessment & Plan Note (Signed)
EKG-- no change from 08/2018

## 2023-02-10 ENCOUNTER — Other Ambulatory Visit: Payer: Self-pay | Admitting: Family

## 2023-05-05 NOTE — Progress Notes (Unsigned)
      Established patient visit   Patient: Alice Thomas   DOB: 1971/04/25   52 y.o. Female  MRN: [id number] Visit Date: 05/06/2023  Today's healthcare provider: Alfredia Ferguson, PA-C   No chief complaint on file.  Subjective    HPI  ***  Medications: Outpatient Medications Prior to Visit  Medication Sig   albuterol (VENTOLIN HFA) 108 (90 Base) MCG/ACT inhaler Inhale 2 puffs into the lungs every 6 (six) hours as needed for wheezing or shortness of breath.   Fluticasone-Umeclidin-Vilant (TRELEGY ELLIPTA) 100-62.5-25 MCG/ACT AEPB Inhale 1 puff into the lungs daily.   levalbuterol (XOPENEX HFA) 45 MCG/ACT inhaler Inhale 2 puffs into the lungs every 6 (six) hours as needed for wheezing or shortness of breath.   levothyroxine (SYNTHROID) 150 MCG tablet TAKE 1 TABLET BY MOUTH DAILY BEFORE BREAKFAST.   sertraline (ZOLOFT) 50 MG tablet TAKE 1 AND 1/2 TABLETS BY MOUTH DAILY   umeclidinium-vilanterol (ANORO ELLIPTA) 62.5-25 MCG/ACT AEPB Inhale 1 puff into the lungs daily at 6 (six) AM.   Vitamin D, Ergocalciferol, (DRISDOL) 1.25 MG (50000 UNIT) CAPS capsule Take 1 capsule (50,000 Units total) by mouth every 7 (seven) days.   No facility-administered medications prior to visit.    Review of Systems    Objective    There were no vitals taken for this visit.  Physical Exam  ***  No results found for any visits on 05/06/23.  Assessment & Plan     ***  No follow-ups on file.      {provider attestation***:1}   Alfredia Ferguson, PA-C  [facility] Primary Care at [facility] [phone] (phone) [phone] (fax)  [facility]

## 2023-05-06 ENCOUNTER — Encounter: Payer: Self-pay | Admitting: Physician Assistant

## 2023-05-06 ENCOUNTER — Ambulatory Visit: Payer: 59 | Admitting: Physician Assistant

## 2023-05-06 VITALS — BP 130/72 | HR 72 | Temp 98.0°F | Resp 20 | Wt 182.0 lb

## 2023-05-06 DIAGNOSIS — R400 Somnolence: Secondary | ICD-10-CM

## 2023-05-06 DIAGNOSIS — Z30432 Encounter for removal of intrauterine contraceptive device: Secondary | ICD-10-CM

## 2023-05-06 DIAGNOSIS — F411 Generalized anxiety disorder: Secondary | ICD-10-CM

## 2023-05-06 DIAGNOSIS — R0683 Snoring: Secondary | ICD-10-CM

## 2023-05-06 MED ORDER — SERTRALINE HCL 50 MG PO TABS: 100.0000 mg | ORAL_TABLET | Freq: Every day | ORAL | 1 refills | Status: DC

## 2023-05-06 NOTE — Assessment & Plan Note (Signed)
Given increase in symptoms recommending increasing current dose of 75 mg to 100 mg of sertraline

## 2023-05-06 NOTE — Patient Instructions (Signed)
 Emergency Mental Health Services:  Crisis Hotline: 91  HandlingCost.fr  Substance Abuse and Mental Health Services [facility]) Hotline:  (406) 840-4815 808-489-9904)  [facility] (like an urgent care for mental health) [address] [place], [place] [address] [phone]  Therapy/ Psychiatry Offices:  [facility] [address] [place],  [place]  [address] [phone] [email or web address]  [facility] [address]  [place], [place] [address] [phone] [email or web address]  [facility] [address] [place], [place] [address] [phone] [email or web address]  [facility]  [address], [place] [address] [phone] [email or web address]  Dr. Milagros Evener  [address] [place] [address] [phone]       [email or web address]  [facility]  [address] [place] [address]. [phone] [email or web address]  [facility] [address] [place], [place] [address] [phone]  [address] [place], [place] [address] [phone] [email or web address]  [facility] [address] [place], [place] [address] [phone] [email or web address]

## 2023-05-19 ENCOUNTER — Telehealth: Payer: Self-pay

## 2023-05-19 NOTE — Telephone Encounter (Signed)
Called patient to schedule new patient appointment. Left voicemail with our contact information to call back and schedule.  

## 2023-06-11 ENCOUNTER — Other Ambulatory Visit: Payer: Self-pay

## 2023-06-11 ENCOUNTER — Ambulatory Visit (HOSPITAL_BASED_OUTPATIENT_CLINIC_OR_DEPARTMENT_OTHER): Payer: 59 | Admitting: Family

## 2023-06-11 ENCOUNTER — Encounter (HOSPITAL_COMMUNITY): Payer: Self-pay | Admitting: Family

## 2023-06-11 VITALS — BP 110/76 | HR 80 | Ht 63.5 in | Wt 182.0 lb

## 2023-06-11 DIAGNOSIS — F330 Major depressive disorder, recurrent, mild: Secondary | ICD-10-CM | POA: Diagnosis not present

## 2023-06-11 DIAGNOSIS — F411 Generalized anxiety disorder: Secondary | ICD-10-CM | POA: Diagnosis not present

## 2023-06-11 MED ORDER — MIRTAZAPINE 7.5 MG PO TABS: 7.5000 mg | ORAL_TABLET | Freq: Every day | ORAL | 0 refills | Status: DC

## 2023-06-11 MED ORDER — SERTRALINE HCL 50 MG PO TABS: 50.0000 mg | ORAL_TABLET | Freq: Every day | ORAL | 0 refills | Status: DC

## 2023-06-11 NOTE — Progress Notes (Signed)
Psychiatric Initial Adult Assessment   Patient Identification: Alice Thomas MRN:  [id number] Date of Evaluation:  06/11/2023 Referral Source: Tora Perches Chief Complaint:  Anxiety and insomnia  Visit Diagnosis:    ICD-10-CM   1. MDD (major depressive disorder), recurrent episode, mild (HCC)  F33.0     2. Anxiety state  F41.1       History of Present Illness:  Alice Thomas 52 year old Caucasian female who presents to establish care.  Reports she is currently in a relationship for the past 2 years and her boyfriend is supportive.  Denied that she has any children.  She reports she was referred by her general practitioner.  She reports a history of hypothyroidism, anxiety and depression.  She reports more recently she has been having mood swings, racing thoughts and inability to concentrate.  Reports she has been taking Zoloft 75 mg for the past 2 years since the start of COVID.  States her primary care provider recently increased her Zoloft to 100 mg and referred her to psychiatry services.   Alice Thomas reports racing thoughts mainly at night.  States she has a difficult time falling and staying asleep.  States she has tried Ambien, trazodone, Xanax and hydroxyzine.  Reports she is currently using CBD Gummies and Unisom to help with her symptoms.  She denied any other illicit drug use to include cocaine heroin or opiate use.  She denied previous inpatient admissions.  Denied self injures behaviors.  Does admit to using CBD and delta 8 to help with mood stabilization.  Patient reports smoking tobacco cigarettes for the past 30 years.  Reports using a pack and a half a day.  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  Reports poor concentration, having issues building relationships and not getting work done.  Documented history with hypothyroidism.  Which is currently managed with levothyroxine.   She denies seizure disorder, headaches/migraines history.  Encouraged discontinuation with over-the-counter THC/CBD  During evaluation Alice Thomas is sitting; she is alert/oriented x 4; calm/cooperative; and mood congruent with affect.  Patient is speaking in a clear tone at moderate volume, and normal pace; with good eye contact. Her thought process is coherent and relevant; There is no indication that she is currently responding to internal/external stimuli or experiencing delusional thought content.  Patient denies suicidal/self-harm/homicidal ideation, psychosis, and paranoia.  Patient has remained calm throughout assessment and has answered questions appropriately.   Associated Signs/Symptoms: Depression Symptoms:  difficulty concentrating, anxiety, (Hypo) Manic Symptoms:  Distractibility, Irritable Mood, Anxiety Symptoms:  Excessive Worry, Psychotic Symptoms:  Hallucinations: None PTSD Symptoms: NA  Past Psychiatric History: Denied   Previous Psychotropic Medications: Yes   Substance Abuse History in the last 12 months:  Yes.    Consequences of Substance Abuse: NA  Past Medical History:  Past Medical History:  Diagnosis Date   Allergy    seasonal   Anemia    History of chicken pox    childhood   History of kidney stones    Thyroid disease    hypothyroidism    Past Surgical History:  Procedure Laterality Date   ENDOMETRIAL BIOPSY     HERNIA REPAIR     5 months of age ? abdominal   INCISION AND DRAINAGE RETROPHYARYNGEAL ABCESS  1992 / 93   KNEE ARTHROSCOPY WITH ANTERIOR CRUCIATE LIGAMENT (ACL) REPAIR Right 09/23/2004   TONSILLECTOMY AND ADENOIDECTOMY  childhood   WISDOM TOOTH EXTRACTION  1992 / 71   this was complicated by a post operative abscess which required hospitalization/ICU care x 9 days- during college    Family Psychiatric History:   Family History:  Family History  Problem Relation Age of Onset   Crohn's disease Father    Alzheimer's disease  Father    Parkinson's disease Father    Hypothyroidism Sister    Heart disease Paternal Grandfather    Breast cancer Paternal Aunt     Social History:   Social History   Socioeconomic History   Marital status: Married    Spouse name: Not on file   Number of children: Not on file   Years of education: Not on file   Highest education level: Not on file  Occupational History   Not on file  Tobacco Use   Smoking status: Every Day    Current packs/day: 0.50    Average packs/day: 0.5 packs/day for 10.0 years (5.0 ttl pk-yrs)    Types: Cigarettes   Smokeless tobacco: Never  Vaping Use   Vaping status: Never Used  Substance and Sexual Activity   Alcohol use: Yes    Alcohol/week: 0.0 - 4.0 standard drinks of alcohol   Drug use: No   Sexual activity: Yes    Partners: Male    Birth control/protection: I.U.D.  Other Topics Concern   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[facility]   Married (second marriage)   No children   No pets   Enjoys volunteering and exercise but has not being doing    Social Determinants of Corporate investment banker Strain: Not on file  Food Insecurity: Not on file  Transportation Needs: Not on file  Physical Activity: Not on file  Stress: Not on file  Social Connections: Not on file    Additional Social History:   Allergies:  No Known Allergies  Metabolic Disorder Labs: No results found for: "HGBA1C", "MPG" No results found for: "PROLACTIN" Lab Results  Component Value Date   CHOL 257 (H) 12/14/2021   TRIG 50 12/14/2021   HDL 75 12/14/2021   CHOLHDL 3.4 12/14/2021   VLDL 15 05/21/2017   LDLCALC 168 (H) 12/14/2021   LDLCALC 161 (H) 11/13/2018   Lab Results  Component Value Date   TSH 2.06 01/28/2023    Therapeutic Level Labs: No results found for: "LITHIUM" No results found for: "CBMZ" No results found for: "VALPROATE"  Current Medications: Current Outpatient Medications  Medication Sig Dispense Refill    levothyroxine (SYNTHROID) 150 MCG tablet TAKE 1 TABLET BY MOUTH DAILY BEFORE BREAKFAST. 90 tablet 1   sertraline (ZOLOFT) 50 MG tablet Take 2 tablets (100 mg total) by mouth daily. 180 tablet 1   No current facility-administered medications for this visit.    Musculoskeletal: Strength & Muscle Tone: within normal limits Gait & Station: normal Patient leans: N/A  Psychiatric Specialty Exam: Review of Systems  Blood pressure 110/76, pulse 80, height 5' 3.5" (1.613 m), weight 182 lb (82.6 kg).Body mass index is 31.73 kg/m.  General Appearance: Casual  Eye Contact:  Good  Speech:  Clear and Coherent  Volume:  Normal  Mood:  Anxious and Depressed  Affect:  Congruent  Thought Process:  Coherent  Orientation:  Full (Time, Place, and Person)  Thought Content:  Logical  Suicidal Thoughts:  No  Homicidal Thoughts:  No  Memory:  Immediate;   Fair Recent;   Fair  Judgement:  Good  Insight:  Good  Psychomotor Activity:  Normal  Concentration:  Concentration: Good  Recall:  Good  Fund of Knowledge:Good  Language: Good  Akathisia:  No  Handed:  Right  AIMS (if indicated):  not done  Assets:  Communication Skills Desire for Improvement Resilience Social Support  ADL's:  Intact  Cognition: WNL  Sleep:  Poor   Screenings: GAD-7    Loss adjuster, chartered Office Visit from 05/06/2023 in [facility] Care at [facility] Office Visit from 06/18/2022 in [facility] Primary Care at [facility] Office Visit from 07/10/2020 in [facility] Care at [facility]  Total GAD-7 Score 14 10 18       PHQ2-9    Flowsheet Row Office Visit from 06/18/2022 in [facility] Primary Care at [facility] Office Visit from 12/14/2021 in [facility] Primary Care at [facility] Office Visit from 07/10/2020 in [facility] Primary Care at [facility] Office Visit from 06/30/2019 in [facility] Primary  Care at [facility] Office Visit from 09/07/2018 in [facility] Primary Care at [facility]  PHQ-2 Total Score 1 0 2 1 3   PHQ-9 Total Score 12 -- 14 6 10        Assessment and Plan: Cheryce Grandstaff 82 year old Caucasian female presents to establish care.  She reports she has been taking Zoloft 75 mg for the past 2 years without any symptom relief.  States for the most part she has been consistent with medications.  No suicidal or homicidal ideations.  Continues to endorse mood irritability, increased anxiety and insomnia symptoms.  Patient was referred for sleep study through her primary care provider.  Discussed downward titration to Zoloft will consider Wellbutrin at follow-up and patient to start Remeron.    Collaboration of Care: Medication Management AEB discussed tapering Zoloft at 100 mg to 75 mg x 1 week.  Downward taper from 100 mg to Zoloft  75 mg x 1 week to Zoloft 50 mg x 2 weeks will consider initiating Wellbutrin  at follow-up. Patient to start  Remeron 7.5 mg nightly,   Patient/Guardian was advised Release of Information must be obtained prior to any record release in order to collaborate their care with an outside provider. Patient/Guardian was advised if they have not already done so to contact the registration department to sign all necessary forms in order for [place] to release information regarding their care.   Consent: Patient/Guardian gives verbal consent for treatment and assignment of benefits for services provided during this visit. Patient/Guardian expressed understanding and agreed to proceed.   Oneta Rack, NP 9/18/20249:48 AM

## 2023-06-18 ENCOUNTER — Ambulatory Visit (INDEPENDENT_AMBULATORY_CARE_PROVIDER_SITE_OTHER): Payer: Self-pay | Admitting: Pulmonary Disease

## 2023-06-18 DIAGNOSIS — R0683 Snoring: Secondary | ICD-10-CM

## 2023-06-18 DIAGNOSIS — G4709 Other insomnia: Secondary | ICD-10-CM

## 2023-06-18 DIAGNOSIS — Z720 Tobacco use: Secondary | ICD-10-CM

## 2023-06-18 NOTE — Patient Instructions (Signed)
Continue Remeron for sleep  Call us in a few weeks if it does not continue to help, we can try Lunesta, Belsomra is another medication we can try  We will schedule you for a home sleep test  Home sleep testing is only valid if you are able to get some sleep-about 4 hours of sleep is usually recommended for the study to be valid  Weight loss efforts  Smoking cessation efforts  Call us with significant concerns  Follow-up in about 2 months

## 2023-06-18 NOTE — Progress Notes (Signed)
Alice Thomas    [id number]    1971/04/24  Primary Care Physician:O'Sullivan, Efraim Kaufmann, NP  Referring Physician: Alfredia Ferguson, PA-C [address] [place],  [place] [address]  Chief complaint:   Patient seen for insomnia, told about snoring  HPI:  Spouse has mentioned snoring recently Not getting enough sleep at night  Chronic insomnia  Recently started on Remeron which she is use for couple of days  She feels she is just not able to shut her mind down when she is trying to sleep at night  She does have anxiety, shortness of breath and dizziness sometimes when she has not slept well Insomnia has been ongoing for about a year Has tried multiple agents including Unisom, CBD Did try prescribed Ambien, trazodone, Xanax with poor response to her the medications  Dad did have OSA  Denies night sweats No headaches Usually goes to bed between 10 and 11 sometimes will take up until 2 to 3 AM before she is able to fall asleep Usually tries to get up out of bed between 8 and 9 AM  She has no significant sleepiness during the day  Over the last couple years has gained over 50 pounds from not sleeping well and eating a lot more at night  She is an active smoker smokes about half a pack a day   Outpatient Encounter Medications as of 06/18/2023  Medication Sig   levothyroxine (SYNTHROID) 150 MCG tablet TAKE 1 TABLET BY MOUTH DAILY BEFORE BREAKFAST.   mirtazapine (REMERON) 7.5 MG tablet Take 1 tablet (7.5 mg total) by mouth at bedtime.   sertraline (ZOLOFT) 50 MG tablet Take 1 tablet (50 mg total) by mouth daily.   [DISCONTINUED] sertraline (ZOLOFT) 50 MG tablet Take 2 tablets (100 mg total) by mouth daily. (Patient not taking: Reported on 06/18/2023)   No facility-administered encounter medications on file as of 06/18/2023.    Allergies as of 06/18/2023   (No Known Allergies)    Past Medical History:  Diagnosis Date   Allergy    seasonal   Anemia     History of chicken pox    childhood   History of kidney stones    Thyroid disease    hypothyroidism    Past Surgical History:  Procedure Laterality Date   ENDOMETRIAL BIOPSY     HERNIA REPAIR     60 months of age ? abdominal   INCISION AND DRAINAGE RETROPHYARYNGEAL ABCESS  1992 / 93   KNEE ARTHROSCOPY WITH ANTERIOR CRUCIATE LIGAMENT (ACL) REPAIR Right 09/23/2004   TONSILLECTOMY AND ADENOIDECTOMY     childhood   WISDOM TOOTH EXTRACTION  1992 / 93   this was complicated by a post operative abscess which required hospitalization/ICU care x 9 days- during college    Family History  Problem Relation Age of Onset   Crohn's disease Father    Alzheimer's disease Father    Parkinson's disease Father    Hypothyroidism Sister    Heart disease Paternal Grandfather    Breast cancer Paternal Aunt     Social History   Socioeconomic History   Marital status: Married    Spouse name: Not on file   Number of children: Not on file   Years of education: Not on file   Highest education level: Not on file  Occupational History   Not on file  Tobacco Use   Smoking status: Every Day    Current packs/day: 0.50  Average packs/day: 0.5 packs/day for 10.0 years (5.0 ttl pk-yrs)    Types: Cigarettes   Smokeless tobacco: Never  Vaping Use   Vaping status: Never Used  Substance and Sexual Activity   Alcohol use: Yes    Alcohol/week: 0.0 - 4.0 standard drinks of alcohol   Drug use: No   Sexual activity: Yes    Partners: Male    Birth control/protection: I.U.D.  Other Topics Concern   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[facility]   Married (second marriage)   No children   No pets   Enjoys volunteering and exercise but has not being doing    Social Determinants of Corporate investment banker Strain: Not on file  Food Insecurity: Not on file  Transportation Needs: Not on file  Physical Activity: Not on file  Stress: Not on file  Social Connections: Not on file   Intimate Partner Violence: Not on file    Review of Systems  Psychiatric/Behavioral:  Positive for sleep disturbance.     There were no vitals filed for this visit.   Physical Exam Constitutional:      Appearance: Normal appearance.  HENT:     Head: Normocephalic.     Mouth/Throat:     Mouth: Mucous membranes are moist.     Comments: Mallampati 3, crowded oropharynx Eyes:     General: No scleral icterus.    Pupils: Pupils are equal, round, and reactive to light.  Cardiovascular:     Rate and Rhythm: Normal rate and regular rhythm.     Heart sounds: No murmur heard.    No friction rub.  Pulmonary:     Effort: No respiratory distress.     Breath sounds: No stridor. No wheezing or rhonchi.  Musculoskeletal:     Cervical back: No rigidity or tenderness.  Neurological:     Mental Status: She is alert.  Psychiatric:        Mood and Affect: Mood normal.       06/18/2023    3:00 PM  Results of the Epworth flowsheet  Sitting and reading 0  Watching TV 0  Sitting, inactive in a public place (e.g. a theatre or a meeting) 0  As a passenger in a car for an hour without a break 0  Lying down to rest in the afternoon when circumstances permit 0  Sitting and talking to someone 0  Sitting quietly after a lunch without alcohol 0  In a car, while stopped for a few minutes in traffic 0  Total score 0     Data Reviewed: No previous sleep study on record  Assessment:  Chronic insomnia -Recently started on Remeron and seems to be helping -Discussed other medication options if Remeron does not seem to be helping  Concern for sleep disordered breathing Likely related to increased weight over the last few months -Encouraged weight loss efforts -Will schedule for home sleep test -If not able to get an adequate amount of sleep, sleep study may be falsely negative  Anxiety/depression -Being treated  Active smoker -Smoking cessation counseling  Pathophysiology of sleep  disordered breathing discussed with the patient Treatment options discussed with the patient  Plan/Recommendations: Will schedule patient for home sleep testing  Continue Remeron -Other options of treatment may include Lunesta or Belsomra that she has not tried in the past  Behavioral measures to optimize sleep hygiene discussed  Sleeping with the head of bed elevated, side sleeping  Avoidance of  behaviors that may contribute to poor sleep discussed  Tentative follow-up in about 2 to 3 months  Encouraged to call with significant concerns   Virl Diamond MD [facility] 06/18/2023, 3:59 PM  CC: Alfredia Ferguson, PA-C

## 2023-06-22 ENCOUNTER — Other Ambulatory Visit (HOSPITAL_COMMUNITY): Payer: Self-pay | Admitting: Family

## 2023-06-23 ENCOUNTER — Ambulatory Visit (HOSPITAL_COMMUNITY): Payer: PRIVATE HEALTH INSURANCE | Admitting: Student

## 2023-07-07 ENCOUNTER — Other Ambulatory Visit (HOSPITAL_COMMUNITY): Payer: Self-pay | Admitting: Family

## 2023-07-08 ENCOUNTER — Ambulatory Visit (HOSPITAL_COMMUNITY): Payer: 59 | Admitting: Family

## 2023-09-10 ENCOUNTER — Other Ambulatory Visit: Payer: Self-pay | Admitting: Family

## 2023-09-10 ENCOUNTER — Ambulatory Visit: Payer: Self-pay | Admitting: Pulmonary Disease

## 2024-03-13 ENCOUNTER — Other Ambulatory Visit: Payer: Self-pay | Admitting: Family

## 2024-03-13 NOTE — Telephone Encounter (Signed)
Please contact pt to schedule follow up appointment.  °

## 2024-04-21 ENCOUNTER — Ambulatory Visit (INDEPENDENT_AMBULATORY_CARE_PROVIDER_SITE_OTHER): Payer: Self-pay | Admitting: Family

## 2024-04-21 ENCOUNTER — Telehealth: Payer: Self-pay | Admitting: Family

## 2024-04-21 VITALS — BP 116/81 | HR 97 | Temp 98.4°F | Resp 16 | Ht 63.5 in | Wt 179.0 lb

## 2024-04-21 DIAGNOSIS — E559 Vitamin D deficiency, unspecified: Secondary | ICD-10-CM | POA: Diagnosis not present

## 2024-04-21 DIAGNOSIS — Z1211 Encounter for screening for malignant neoplasm of colon: Secondary | ICD-10-CM

## 2024-04-21 DIAGNOSIS — G4700 Insomnia, unspecified: Secondary | ICD-10-CM

## 2024-04-21 DIAGNOSIS — I77810 Thoracic aortic ectasia: Secondary | ICD-10-CM

## 2024-04-21 DIAGNOSIS — F32A Depression, unspecified: Secondary | ICD-10-CM | POA: Insufficient documentation

## 2024-04-21 DIAGNOSIS — Z1231 Encounter for screening mammogram for malignant neoplasm of breast: Secondary | ICD-10-CM

## 2024-04-21 DIAGNOSIS — F419 Anxiety disorder, unspecified: Secondary | ICD-10-CM

## 2024-04-21 DIAGNOSIS — E039 Hypothyroidism, unspecified: Secondary | ICD-10-CM | POA: Diagnosis not present

## 2024-04-21 MED ORDER — ESZOPICLONE 1 MG PO TABS: 1.0000 mg | ORAL_TABLET | Freq: Every evening | ORAL | 0 refills | Status: DC | PRN

## 2024-04-21 MED ORDER — SERTRALINE HCL 100 MG PO TABS: 100.0000 mg | ORAL_TABLET | Freq: Every day | ORAL | 1 refills | Status: DC

## 2024-04-21 NOTE — Assessment & Plan Note (Signed)
  Increased irritability and mood disturbance noted. Dissatisfied with psychiatric consultation, prefers management with current provider. Progesterone slightly improved sleep. - Prescribe sertraline  100 mg daily. - Discontinue mirtazapine . - Consider therapy options; provide pamphlet for local therapists.

## 2024-04-21 NOTE — Assessment & Plan Note (Signed)
  Difficulty initiating sleep, maintains sleep once achieved. Limited success with previous sleep aids. Discussed Lunesta  as an option, covered by insurance. Informed of potential side effects. - Prescribe Lunesta  for sleep. - Complete controlled substance contract and urine sample for Lunesta . - Monitor for side effects such as sleepwalking and morning drowsiness. -Controlled substance contract is updated today and we will obtain UDS.

## 2024-04-21 NOTE — Progress Notes (Signed)
 Subjective:     Patient ID: Alice Thomas, female    DOB: 1971-05-16, 53 y.o.   MRN: [id number]  Chief Complaint  Patient presents with   Annual Exam    HPI  Discussed the use of AI scribe software for clinical note transcription with the patient, who gave verbal consent to proceed.  History of Present Illness Alice Thomas is a 53 year old female who presents today with c/o irritability and insomnia.  She experiences significant irritability and mood disturbances, feeling extremely agitated and reactive, particularly in response to stress in her relationship with her husband. She has difficulty initiating sleep, although once asleep, she sleeps well. She has tried various medications for sleep, including Ambien , Xanax , and trazodone , without significant improvement. Recently, she started taking progesterone, which has helped somewhat with sleep initiation. She has also tried CBD to help calm her mind before sleep.  Her medication history includes a previous taper to 50 mg of sertraline  by a psychiatrist, with a prescription for mirtazapine  for sleep. After discontinuing mirtazapine , she returned to 100 mg of sertraline . She is currently taking Synthroid  for hypothyroidism. She experiences menopausal symptoms, including hot flashes and mood changes, which she describes as becoming irritable, irrational, and intolerant. She has a history of low vitamin D  levels and is not currently taking a supplement for it.      Health Maintenance Due  Topic Date Due   Pneumococcal Vaccine 61-41 Years old (1 of 2 - PCV) Never done   Hepatitis B Vaccines (1 of 3 - 19+ 3-dose series) Never done   Colonoscopy  Never done   MAMMOGRAM  12/19/2022   COVID-19 Vaccine (3 - 2024-25 season) 05/25/2023    Past Medical History:  Diagnosis Date   Allergy    seasonal   Anemia    History of chicken pox    childhood   History of kidney stones    Thyroid  disease    hypothyroidism    Past Surgical  History:  Procedure Laterality Date   ENDOMETRIAL BIOPSY     HERNIA REPAIR     69 months of age ? abdominal   INCISION AND DRAINAGE RETROPHYARYNGEAL ABCESS  1992 / 93   KNEE ARTHROSCOPY WITH ANTERIOR CRUCIATE LIGAMENT (ACL) REPAIR Right 09/23/2004   TONSILLECTOMY AND ADENOIDECTOMY     childhood   WISDOM TOOTH EXTRACTION  1992 / 93   this was complicated by a post operative abscess which required hospitalization/ICU care x 9 days- during college    Family History  Problem Relation Age of Onset   Crohn's disease Father    Alzheimer's disease Father    Parkinson's disease Father    Hypothyroidism Sister    Heart disease Paternal Grandfather    Breast cancer Paternal Aunt     Social History   Socioeconomic History   Marital status: Married    Spouse name: Not on file   Number of children: Not on file   Years of education: Not on file   Highest education level: Not on file  Occupational History   Not on file  Tobacco Use   Smoking status: Every Day    Current packs/day: 0.50    Average packs/day: 0.5 packs/day for 10.0 years (5.0 ttl pk-yrs)    Types: Cigarettes   Smokeless tobacco: Never  Vaping Use   Vaping status: Never Used  Substance and Sexual Activity   Alcohol use: Yes    Alcohol/week: 0.0 - 4.0 standard drinks of alcohol   Drug  use: No   Sexual activity: Yes    Partners: Male    Birth control/protection: I.U.D.  Other Topics Concern   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[facility]   Married (second marriage)   No children   No pets   Enjoys volunteering and exercise but has not being doing    Social Drivers of Corporate investment banker Strain: Low Risk  (03/10/2024)   Received from [facility]   Overall Financial Resource Strain (CARDIA)    Difficulty of Paying Living Expenses: Not hard at all  Food Insecurity: No Food Insecurity (03/10/2024)   Received from [facility]   Hunger Vital Sign    Within the past 12 months, you worried that  your food would run out before you got the money to buy more.: Never true    Within the past 12 months, the food you bought just didn't last and you didn't have money to get more.: Never true  Transportation Needs: No Transportation Needs (03/10/2024)   Received from [facility] - Transportation    Lack of Transportation (Medical): No    Lack of Transportation (Non-Medical): No  Physical Activity: Not on file  Stress: Not on file  Social Connections: Not on file  Intimate Partner Violence: Not on file    Outpatient Medications Prior to Visit  Medication Sig Dispense Refill   estradiol (VIVELLE-DOT) 0.05 MG/24HR patch 1 patch 2 (two) times a week.     levothyroxine  (SYNTHROID ) 150 MCG tablet TAKE 1 TABLET BY MOUTH EVERY DAY BEFORE BREAKFAST 30 tablet 0   progesterone (PROMETRIUM) 100 MG capsule Take 100 mg by mouth at bedtime.     mirtazapine  (REMERON ) 7.5 MG tablet Take 1 tablet (7.5 mg total) by mouth at bedtime. 30 tablet 0   sertraline  (ZOLOFT ) 50 MG tablet TAKE 1 AND 1/2 TABLETS DAILY BY MOUTH 45 tablet 0   No facility-administered medications prior to visit.    No Known Allergies  ROS See HPI    Objective:    Physical Exam Constitutional:      General: She is not in acute distress.    Appearance: Normal appearance. She is well-developed.  HENT:     Head: Normocephalic and atraumatic.     Right Ear: External ear normal.     Left Ear: External ear normal.  Eyes:     General: No scleral icterus. Cardiovascular:     Rate and Rhythm: Normal rate.  Pulmonary:     Effort: Pulmonary effort is normal.  Musculoskeletal:        General: No swelling.  Skin:    General: Skin is warm and dry.  Neurological:     Mental Status: She is alert and oriented to person, place, and time.  Psychiatric:        Mood and Affect: Mood normal.        Behavior: Behavior normal.        Thought Content: Thought content normal.        Judgment: Judgment normal.      BP  116/81 (BP Location: Right Arm, Patient Position: Sitting, Cuff Size: Normal)   Pulse 97   Temp 98.4 F (36.9 C) (Oral)   Resp 16   Ht 5' 3.5 (1.613 m)   Wt 179 lb (81.2 kg)   SpO2 98%   BMI 31.21 kg/m  Wt Readings from Last 3 Encounters:  04/21/24 179 lb (81.2 kg)  05/06/23 182 lb (  82.6 kg)  01/28/23 190 lb 12.8 oz (86.5 kg)       Assessment & Plan:   Problem List Items Addressed This Visit       Unprioritized   Insomnia - Primary    Difficulty initiating sleep, maintains sleep once achieved. Limited success with previous sleep aids. Discussed Lunesta  as an option, covered by insurance. Informed of potential side effects. - Prescribe Lunesta  for sleep. - Complete controlled substance contract and urine sample for Lunesta . - Monitor for side effects such as sleepwalking and morning drowsiness. -Controlled substance contract is updated today and we will obtain UDS.      Relevant Medications   eszopiclone  (LUNESTA ) 1 MG TABS tablet   Other Relevant Orders   DRUG MONITORING, PANEL 8 WITH CONFIRMATION, URINE   Hypothyroidism   Clinically stable on synthroid . Update TSH.       Relevant Orders   TSH   Ascending aorta dilatation (HCC)   Noted back in 2020 on echo.  Will update echo.       Relevant Orders   ECHOCARDIOGRAM COMPLETE   Other Visit Diagnoses       Anxiety and depression       Relevant Medications   sertraline  (ZOLOFT ) 100 MG tablet     Breast cancer screening by mammogram       Relevant Orders   MM 3D SCREENING MAMMOGRAM BILATERAL BREAST     Screening for colon cancer       Relevant Orders   Ambulatory referral to Gastroenterology     Vitamin D  deficiency       Relevant Orders   VITAMIN D  25 Hydroxy (Vit-D Deficiency, Fractures)     Hypercalcemia       Relevant Orders   Comp Met (CMET)       I have discontinued Rhea Hally's mirtazapine  and sertraline . I am also having her start on eszopiclone  and sertraline . Additionally, I am having her  maintain her levothyroxine , estradiol, and progesterone.  Meds ordered this encounter  Medications   eszopiclone  (LUNESTA ) 1 MG TABS tablet    Sig: Take 1 tablet (1 mg total) by mouth at bedtime as needed for sleep. Take immediately before bedtime    Dispense:  30 tablet    Refill:  0    Supervising Provider:   DOMENICA BLACKBIRD A [[id number]]   sertraline  (ZOLOFT ) 100 MG tablet    Sig: Take 1 tablet (100 mg total) by mouth daily.    Dispense:  90 tablet    Refill:  1    Supervising Provider:   DOMENICA BLACKBIRD A [[id number]]

## 2024-04-21 NOTE — Assessment & Plan Note (Signed)
 Noted back in 2020 on echo.  Will update echo.

## 2024-04-21 NOTE — Assessment & Plan Note (Signed)
Clinically stable on synthroid. Update TSH.

## 2024-04-21 NOTE — Patient Instructions (Signed)
 VISIT SUMMARY:  Today, we discussed your irritability, mood disturbances, and difficulty initiating sleep. We reviewed your current medications and made some adjustments to better manage your symptoms.  YOUR PLAN:  DEPRESSION AND MOOD DISTURBANCE: You have been experiencing increased irritability and mood disturbances, particularly related to stress in your relationship. -Continue taking sertraline  100 mg daily. -Stop taking mirtazapine . -Consider therapy options; I have provided you with a pamphlet for local therapists.  INSOMNIA: You have difficulty initiating sleep but maintain sleep once you fall asleep. Previous sleep aids have had limited success. -Start taking Lunesta  for sleep. -Complete the controlled substance contract and provide a urine sample for Lunesta . -Monitor for side effects such as sleepwalking and morning drowsiness.  HYPOTHYROIDISM: Your hypothyroidism is well-managed with your current medication. -Continue taking Synthroid  as prescribed.  GENERAL HEALTH MAINTENANCE: You are due for routine health screenings and your vitamin D  levels were slightly low last year. -Schedule a mammogram. -Schedule a colonoscopy. -Check your vitamin D  levels and consider supplementation if they are low.

## 2024-04-21 NOTE — Telephone Encounter (Signed)
 See mychart.

## 2024-04-22 ENCOUNTER — Ambulatory Visit: Payer: Self-pay | Admitting: Family

## 2024-04-22 ENCOUNTER — Other Ambulatory Visit: Payer: Self-pay | Admitting: Family

## 2024-04-22 ENCOUNTER — Telehealth: Payer: Self-pay | Admitting: Family

## 2024-04-22 DIAGNOSIS — E039 Hypothyroidism, unspecified: Secondary | ICD-10-CM

## 2024-04-22 MED ORDER — VITAMIN D3 75 MCG (3000 UT) PO TABS: 1.0000 | ORAL_TABLET | Freq: Every day | ORAL | Status: AC

## 2024-04-22 NOTE — Telephone Encounter (Signed)
 Lab work shows that synthroid  needs to be increased.  Please increase to 175 mcg.  Repeat TSH in 6 weeks.

## 2024-04-23 LAB — TSH: TSH: 5.16 m[IU]/L — ABNORMAL HIGH

## 2024-04-23 LAB — VITAMIN D 25 HYDROXY (VIT D DEFICIENCY, FRACTURES): Vit D, 25-Hydroxy: 35 ng/mL (ref 30–100)

## 2024-04-23 LAB — DRUG MONITORING, PANEL 8 WITH CONFIRMATION, URINE
6 Acetylmorphine: NEGATIVE ng/mL (ref <10)
Alcohol Metabolites: NEGATIVE ng/mL (ref <500)
Amphetamines: NEGATIVE ng/mL (ref <500)
Benzodiazepines: NEGATIVE ng/mL (ref <100)
Buprenorphine, Urine: NEGATIVE ng/mL (ref <5)
Cocaine Metabolite: NEGATIVE ng/mL (ref <150)
Creatinine: 20 mg/dL (ref >=20.0)
MDMA: NEGATIVE ng/mL (ref <500)
Marijuana Metabolite: 23 ng/mL — ABNORMAL HIGH (ref <5)
Marijuana Metabolite: POSITIVE ng/mL — AB (ref <20)
Opiates: NEGATIVE ng/mL (ref <100)
Oxidant: NEGATIVE ug/mL (ref <200)
Oxycodone: NEGATIVE ng/mL (ref <100)
pH: 6.3 (ref 4.5–9.0)

## 2024-04-23 LAB — COMPREHENSIVE METABOLIC PANEL WITH GFR
AG Ratio: 2 (calc) (ref 1.0–2.5)
ALT: 13 U/L (ref 6–29)
AST: 14 U/L (ref 10–35)
Albumin: 4.7 g/dL (ref 3.6–5.1)
Alkaline phosphatase (APISO): 45 U/L (ref 37–153)
BUN: 15 mg/dL (ref 7–25)
CO2: 28 mmol/L (ref 20–32)
Calcium: 10.3 mg/dL (ref 8.6–10.4)
Chloride: 104 mmol/L (ref 98–110)
Creat: 0.76 mg/dL (ref 0.50–1.03)
Globulin: 2.3 g/dL (ref 1.9–3.7)
Glucose, Bld: 94 mg/dL (ref 65–99)
Potassium: 4.1 mmol/L (ref 3.5–5.3)
Sodium: 139 mmol/L (ref 135–146)
Total Bilirubin: 0.3 mg/dL (ref 0.2–1.2)
Total Protein: 7 g/dL (ref 6.1–8.1)
eGFR: 93 mL/min/1.73m2 (ref >=60)

## 2024-04-23 LAB — DM TEMPLATE

## 2024-04-26 NOTE — Telephone Encounter (Signed)
 Opened in error

## 2024-04-26 NOTE — Telephone Encounter (Signed)
 Please advise pt that it looks like her synthroid  dose needs to be increased.  Please confirm that she is currently taking 150 mcg daily. If so, she should increase to 175 mcg daily and repeat TSH in 6 weeks. Orders pended.  Vit D level looks good, continue daily otc supplement.

## 2024-04-28 ENCOUNTER — Other Ambulatory Visit: Payer: Self-pay | Admitting: Family

## 2024-04-28 MED ORDER — LEVOTHYROXINE SODIUM 175 MCG PO TABS: 175.0000 ug | ORAL_TABLET | Freq: Every day | ORAL | 0 refills | Status: DC

## 2024-04-28 NOTE — Telephone Encounter (Signed)
 Patient notified of results, new medication dose and scheduled to come back in 6 weeks for TSH

## 2024-04-29 NOTE — Telephone Encounter (Signed)
Duplicate encounter. Please disregard.

## 2024-05-19 ENCOUNTER — Ambulatory Visit (HOSPITAL_BASED_OUTPATIENT_CLINIC_OR_DEPARTMENT_OTHER): Attending: Family

## 2024-05-21 ENCOUNTER — Encounter: Payer: Self-pay | Admitting: Family

## 2024-05-21 ENCOUNTER — Ambulatory Visit (INDEPENDENT_AMBULATORY_CARE_PROVIDER_SITE_OTHER): Admitting: Family

## 2024-05-21 VITALS — BP 125/85 | HR 80 | Temp 98.7°F | Resp 16 | Ht 63.5 in | Wt 176.0 lb

## 2024-05-21 DIAGNOSIS — Z23 Encounter for immunization: Secondary | ICD-10-CM | POA: Diagnosis not present

## 2024-05-21 DIAGNOSIS — E039 Hypothyroidism, unspecified: Secondary | ICD-10-CM | POA: Diagnosis not present

## 2024-05-21 DIAGNOSIS — F32A Depression, unspecified: Secondary | ICD-10-CM

## 2024-05-21 DIAGNOSIS — F419 Anxiety disorder, unspecified: Secondary | ICD-10-CM

## 2024-05-21 DIAGNOSIS — G4700 Insomnia, unspecified: Secondary | ICD-10-CM

## 2024-05-21 DIAGNOSIS — Z0000 Encounter for general adult medical examination without abnormal findings: Secondary | ICD-10-CM

## 2024-05-21 DIAGNOSIS — Z1211 Encounter for screening for malignant neoplasm of colon: Secondary | ICD-10-CM

## 2024-05-21 MED ORDER — ESZOPICLONE 1 MG PO TABS: 1.0000 mg | ORAL_TABLET | Freq: Every evening | ORAL | 2 refills | Status: DC | PRN

## 2024-05-21 MED ORDER — BUPROPION HCL ER (XL) 150 MG PO TB24: 150.0000 mg | ORAL_TABLET | Freq: Every day | ORAL | 0 refills | Status: DC

## 2024-05-21 MED ORDER — SERTRALINE HCL 100 MG PO TABS: 100.0000 mg | ORAL_TABLET | Freq: Every day | ORAL | 0 refills | Status: DC

## 2024-05-21 NOTE — Assessment & Plan Note (Signed)
 Much better with lunesta  and progesterone.

## 2024-05-21 NOTE — Progress Notes (Signed)
 Subjective:     Patient ID: Alice Thomas, female    DOB: May 09, 1971, 53 y.o.   MRN: [id number]  Chief Complaint  Patient presents with   Annual Exam    HPI  Discussed the use of AI scribe software for clinical note transcription with the patient, who gave verbal consent to proceed.  History of Present Illness  Alice Thomas is a 53 year old female who presents for a routine physical exam.  She experiences insomnia, which has improved with Lunesta  and progesterone, though she still struggles to stay asleep. She is currently taking sertraline  for anxiety and depression, having recently replaced a lost bottle. Her TSH levels were checked on July 30th, and she has been on an increased dose of Synthroid  for about three weeks. She is not ready for another test yet.  Her immunization status includes not having received the hepatitis B vaccine. She is aware of the recommendation for the pneumonia vaccine for those 50 and older. She acknowledges the need for improvement in her diet and exercise routine, feeling better when active but struggling with consistency.  She has not had a period since having an IUD removed about a month ago and is unsure if she is currently spotting. A mammogram order was placed on July 30th, and she is up to date on her pap smear, which was done in 2023 and is valid for five years. She experiences frequent headaches and has concerns about depression and anxiety.  Immunizations: due for heplisav B and prevnar Diet: needs improvement Exercise: needs improvement Colonoscopy: due Pap Smear:  2023 normal Mammogram: due Vision: up to date Dental: up to date     05/21/2024    3:08 PM 04/21/2024    3:17 PM 06/11/2023    9:52 AM  PHQ9 SCORE ONLY  PHQ-9 Total Score 18 16       Information is confidential and restricted. Go to Review Flowsheets to unlock data.   Data saved with a previous flowsheet row definition      05/21/2024    3:08 PM 04/21/2024    3:17 PM  05/06/2023    1:11 PM 06/18/2022   10:49 AM  GAD 7 : Generalized Anxiety Score  Nervous, Anxious, on Edge 3 2 3 1   Control/stop worrying 3 1 1 1   Worry too much - different things 3 1 1 2   Trouble relaxing 3 3 3 2   Restless 3 3 3 2   Easily annoyed or irritable 3 3 3 2   Afraid - awful might happen 0 0 0 0  Total GAD 7 Score 18 13 14 10   Anxiety Difficulty Extremely difficult Very difficult Somewhat difficult Somewhat difficult        Health Maintenance Due  Topic Date Due   Colonoscopy  Never done   MAMMOGRAM  12/19/2022   COVID-19 Vaccine (3 - 2024-25 season) 05/25/2023   INFLUENZA VACCINE  04/23/2024    Past Medical History:  Diagnosis Date   Allergy    seasonal   Anemia    History of chicken pox    childhood   History of kidney stones    Thyroid  disease    hypothyroidism    Past Surgical History:  Procedure Laterality Date   ENDOMETRIAL BIOPSY     HERNIA REPAIR     71 months of age ? abdominal   INCISION AND DRAINAGE RETROPHYARYNGEAL ABCESS  1992 / 93   KNEE ARTHROSCOPY WITH ANTERIOR CRUCIATE LIGAMENT (ACL) REPAIR Right 09/23/2004   TONSILLECTOMY  AND ADENOIDECTOMY     childhood   WISDOM TOOTH EXTRACTION  1992 / 76   this was complicated by a post operative abscess which required hospitalization/ICU care x 9 days- during college    Family History  Problem Relation Age of Onset   Crohn's disease Father    Alzheimer's disease Father    Parkinson's disease Father    Hypothyroidism Sister    Heart disease Paternal Grandfather    Breast cancer Paternal Aunt     Social History   Socioeconomic History   Marital status: Married    Spouse name: Not on file   Number of children: Not on file   Years of education: Not on file   Highest education level: Not on file  Occupational History   Not on file  Tobacco Use   Smoking status: Every Day    Current packs/day: 0.50    Average packs/day: 0.5 packs/day for 10.0 years (5.0 ttl pk-yrs)    Types: Cigarettes    Smokeless tobacco: Never  Vaping Use   Vaping status: Never Used  Substance and Sexual Activity   Alcohol use: Yes    Alcohol/week: 0.0 - 4.0 standard drinks of alcohol   Drug use: No   Sexual activity: Yes    Partners: Male    Birth control/protection: I.U.D.  Other Topics Concern   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[facility]   Married (second marriage)   No children   No pets   Enjoys volunteering and exercise but has not being doing    Social Drivers of Corporate investment banker Strain: Low Risk  (03/10/2024)   Received from [facility]   Overall Financial Resource Strain (CARDIA)    Difficulty of Paying Living Expenses: Not hard at all  Food Insecurity: No Food Insecurity (03/10/2024)   Received from [facility]   Hunger Vital Sign    Within the past 12 months, you worried that your food would run out before you got the money to buy more.: Never true    Within the past 12 months, the food you bought just didn't last and you didn't have money to get more.: Never true  Transportation Needs: No Transportation Needs (03/10/2024)   Received from [facility] - Transportation    Lack of Transportation (Medical): No    Lack of Transportation (Non-Medical): No  Physical Activity: Not on file  Stress: Not on file  Social Connections: Not on file  Intimate Partner Violence: Not on file    Outpatient Medications Prior to Visit  Medication Sig Dispense Refill   Cholecalciferol (VITAMIN D3) 75 MCG (3000 UT) TABS Take 1 tablet by mouth daily at 6 (six) AM.     estradiol (VIVELLE-DOT) 0.05 MG/24HR patch 1 patch 2 (two) times a week.     levothyroxine  (SYNTHROID ) 175 MCG tablet Take 1 tablet (175 mcg total) by mouth daily. 90 tablet 0   progesterone (PROMETRIUM) 100 MG capsule Take 100 mg by mouth at bedtime.     eszopiclone  (LUNESTA ) 1 MG TABS tablet Take 1 tablet (1 mg total) by mouth at bedtime as needed for sleep. Take immediately before bedtime 30  tablet 0   sertraline  (ZOLOFT ) 100 MG tablet Take 1 tablet (100 mg total) by mouth daily. 90 tablet 1   No facility-administered medications prior to visit.    No Known Allergies  ROS    See HPI Objective:    Physical Exam  BP 125/85 (BP Location: Right Arm, Patient Position: Sitting, Cuff Size: Small)   Pulse 80   Temp 98.7 F (37.1 C) (Oral)   Resp 16   Ht 5' 3.5 (1.613 m)   Wt 176 lb (79.8 kg)   SpO2 100%   BMI 30.69 kg/m  Wt Readings from Last 3 Encounters:  05/21/24 176 lb (79.8 kg)  04/21/24 179 lb (81.2 kg)  05/06/23 182 lb (82.6 kg)  Physical Exam  Constitutional: She is oriented to person, place, and time. She appears well-developed and well-nourished. No distress.  HENT:  Head: Normocephalic and atraumatic.  Right Ear: Tympanic membrane and ear canal normal.  Left Ear: Tympanic membrane and ear canal normal.  Mouth/Throat: Oropharynx is clear and moist.  Eyes: Pupils are equal, round, and reactive to light. No scleral icterus.  Neck: Normal range of motion. No thyromegaly present.  Cardiovascular: Normal rate and regular rhythm.   No murmur heard. Pulmonary/Chest: Effort normal and breath sounds normal. No respiratory distress. He has no wheezes. She has no rales. She exhibits no tenderness.  Abdominal: Soft. Bowel sounds are normal. She exhibits no distension and no mass. There is no tenderness. There is no rebound and no guarding.  Musculoskeletal: She exhibits no edema.  Lymphadenopathy:    She has no cervical adenopathy.  Neurological: She is alert and oriented to person, place, and time. She has normal patellar reflexes. She exhibits normal muscle tone. Coordination normal.  Skin: Skin is warm and dry.  Psychiatric: She has a normal mood and affect. Her behavior is normal. Judgment and thought content normal.  Breast/Pelvic: deferred           Assessment & Plan:        Assessment & Plan:   Problem List Items Addressed This Visit        Unprioritized   Preventative health care - Primary    Routine adult wellness visit. Discussed immunizations, screenings, and general health maintenance. Agreed to hepatitis B and pneumonia vaccines. - Administer hepatitis B vaccine today. - Administer pneumonia vaccine today. - Schedule second hepatitis B vaccine in one month. - Place referral to GI for colonoscopy. - Schedule mammogram. - Schedule TSH lab visit in conjunction with second hepatitis B vaccine. - Discuss healthy diet and exercise.      Insomnia   Much better with lunesta  and progesterone.       Relevant Medications   eszopiclone  (LUNESTA ) 1 MG TABS tablet   Hypothyroidism    Managed with Synthroid . Recent TSH showed improvement. Increased dose for approximately three weeks. - Place future order for TSH lab test.      Relevant Orders   TSH   Anxiety and depression   Uncontrolled.  High scores on depression and anxiety scales. Currently on sertraline . - Prescribe Wellbutrin  to address depression and assist with smoking cessation. - Send [facility]ertraline  prescription to [facility] on [address]. - Schedule follow-up in one month to assess Wellbutrin  efficacy.      Relevant Medications   sertraline  (ZOLOFT ) 100 MG tablet   buPROPion  (WELLBUTRIN  XL) 150 MG 24 hr tablet   Other Visit Diagnoses       Screening for colon cancer       Relevant Orders   Ambulatory referral to Gastroenterology     Need for pneumococcal 20-valent conjugate vaccination       Relevant Orders   Pneumococcal conjugate vaccine 20-valent (Prevnar 20) (Completed)     Need for hepatitis B vaccination  Relevant Orders   Heplisav-B  (HepB-CPG) Vaccine (Completed)       I am having Alice Thomas start on buPROPion . I am also having her maintain her estradiol, progesterone, Vitamin D3, levothyroxine , eszopiclone , and sertraline .  Meds ordered this encounter  Medications   eszopiclone  (LUNESTA ) 1 MG TABS tablet    Sig: Take 1 tablet  (1 mg total) by mouth at bedtime as needed for sleep. Take immediately before bedtime    Dispense:  30 tablet    Refill:  2    Supervising Provider:   DOMENICA BLACKBIRD A [[id number]]   sertraline  (ZOLOFT ) 100 MG tablet    Sig: Take 1 tablet (100 mg total) by mouth daily.    Dispense:  90 tablet    Refill:  0    Supervising Provider:   DOMENICA BLACKBIRD A [[id number]]   buPROPion  (WELLBUTRIN  XL) 150 MG 24 hr tablet    Sig: Take 1 tablet (150 mg total) by mouth daily.    Dispense:  90 tablet    Refill:  0    Supervising Provider:   DOMENICA BLACKBIRD A [[id number]]

## 2024-05-22 NOTE — Assessment & Plan Note (Signed)
  Managed with Synthroid . Recent TSH showed improvement. Increased dose for approximately three weeks. - Place future order for TSH lab test.

## 2024-05-22 NOTE — Assessment & Plan Note (Signed)
 Uncontrolled.  High scores on depression and anxiety scales. Currently on sertraline . - Prescribe Wellbutrin  to address depression and assist with smoking cessation. - [facility]  pres[facility] to [facility] on [address]. - Schedule follow-up in one month to assess Wellbutrin  efficacy.

## 2024-05-22 NOTE — Patient Instructions (Signed)
 VISIT SUMMARY:  You had a routine physical exam where we discussed your insomnia, depression, anxiety, hypothyroidism, and perimenopausal symptoms. We also reviewed your immunization status and general health maintenance.  YOUR PLAN:  MAJOR DEPRESSIVE DISORDER AND ANXIETY DISORDER: You have high scores on depression and anxiety scales and are currently taking sertraline . -We prescribed Wellbutrin  to help with depression and assist with smoking cessation. -We [facility]. -Please schedule a follow-up in one month to assess how Wellbutrin  is working for you.  INSOMNIA: Your insomnia has improved with Lunesta  and progesterone, but you still have some difficulty staying asleep. -Continue taking Lunesta . -We have refilled your Lunesta  prescription.  HYPOTHYROIDISM: Your hypothyroidism is being managed with Synthroid , and your recent TSH levels showed improvement. -We will place a future order for a TSH lab test. -Schedule your TSH lab visit to coincide with your second hepatitis B vaccine.  PERIMENOPAUSAL SYMPTOMS: Your perimenopausal symptoms are being managed with progesterone. The estriol patch was discontinued due to itching. -Continue taking progesterone. -Discontinue the estriol patch.  ADULT WELLNESS VISIT: This was a routine adult wellness visit where we discussed immunizations, screenings, and general health maintenance. -We administered the hepatitis B and pneumonia vaccines today. -Schedule your second hepatitis B vaccine in one month. -We placed a referral to GI for a colonoscopy. -Schedule your mammogram. -Schedule your TSH lab visit to coincide with your second hepatitis B vaccine. -Continue to work on maintaining a healthy diet and regular exercise.

## 2024-05-22 NOTE — Assessment & Plan Note (Signed)
  Routine adult wellness visit. Discussed immunizations, screenings, and general health maintenance. Agreed to hepatitis B and pneumonia vaccines. - Administer hepatitis B vaccine today. - Administer pneumonia vaccine today. - Schedule second hepatitis B vaccine in one month. - Place referral to GI for colonoscopy. - Schedule mammogram. - Schedule TSH lab visit in conjunction with second hepatitis B vaccine. - Discuss healthy diet and exercise.

## 2024-06-14 ENCOUNTER — Encounter: Payer: Self-pay | Admitting: Family

## 2024-06-14 ENCOUNTER — Other Ambulatory Visit

## 2024-06-16 ENCOUNTER — Encounter (HOSPITAL_BASED_OUTPATIENT_CLINIC_OR_DEPARTMENT_OTHER): Payer: Self-pay

## 2024-06-16 ENCOUNTER — Ambulatory Visit: Payer: Self-pay | Admitting: Family

## 2024-06-16 ENCOUNTER — Other Ambulatory Visit (HOSPITAL_BASED_OUTPATIENT_CLINIC_OR_DEPARTMENT_OTHER)

## 2024-06-16 ENCOUNTER — Ambulatory Visit: Admitting: Family

## 2024-06-16 ENCOUNTER — Ambulatory Visit (HOSPITAL_BASED_OUTPATIENT_CLINIC_OR_DEPARTMENT_OTHER)
Admission: RE | Admit: 2024-06-16 | Discharge: 2024-06-16 | Disposition: A | Discharge status: Home / Self Care | Source: Ambulatory Visit | Attending: Family | Admitting: Family

## 2024-06-16 VITALS — BP 116/77 | HR 90 | Temp 98.6°F | Resp 16 | Ht 63.5 in | Wt 177.0 lb

## 2024-06-16 DIAGNOSIS — R0602 Shortness of breath: Secondary | ICD-10-CM | POA: Insufficient documentation

## 2024-06-16 MED ORDER — ALBUTEROL SULFATE HFA 108 (90 BASE) MCG/ACT IN AERS: 2.0000 | INHALATION_SPRAY | Freq: Four times a day (QID) | RESPIRATORY_TRACT | 0 refills | Status: AC | PRN

## 2024-06-16 MED ORDER — IOHEXOL 350 MG/ML SOLN
100.0000 mL | Freq: Once | INTRAVENOUS | Status: AC | PRN
  Administered 2024-06-16: 75 mL via INTRAVENOUS

## 2024-06-16 NOTE — Progress Notes (Signed)
 Subjective:     Patient ID: Alice Thomas, female    DOB: 1970-10-26, 53 y.o.   MRN: [id number]  Chief Complaint  Patient presents with   Shortness of Breath    Patient complains of sob for about 2 weeks    Shortness of Breath    Discussed the use of AI scribe software for clinical note transcription with the patient, who gave verbal consent to proceed.  History of Present Illness  Patient is a 53 yr old female who presents today with c/o SOB.  She first noticed about 2 weeks ago.  Feels like she can't take a full breath. Denies cough, wheezing, chest pain or palpitations.  She was placed on HRT a few weeks back and recently flew to [place] which was a long flight.       Health Maintenance Due  Topic Date Due   Colonoscopy  Never done   Mammogram  12/19/2022   Influenza Vaccine  04/23/2024   COVID-19 Vaccine (3 - 2025-26 season) 05/24/2024    Past Medical History:  Diagnosis Date   Allergy    seasonal   Anemia    History of chicken pox    childhood   History of kidney stones    Thyroid  disease    hypothyroidism    Past Surgical History:  Procedure Laterality Date   ENDOMETRIAL BIOPSY     HERNIA REPAIR     105 months of age ? abdominal   INCISION AND DRAINAGE RETROPHYARYNGEAL ABCESS  1992 / 93   KNEE ARTHROSCOPY WITH ANTERIOR CRUCIATE LIGAMENT (ACL) REPAIR Right 09/23/2004   TONSILLECTOMY AND ADENOIDECTOMY     childhood   WISDOM TOOTH EXTRACTION  1992 / 89   this was complicated by a post operative abscess which required hospitalization/ICU care x 9 days- during college    Family History  Problem Relation Age of Onset   Crohn's disease Father    Alzheimer's disease Father    Parkinson's disease Father    Hypothyroidism Sister    Heart disease Paternal Grandfather    Breast cancer Paternal Aunt     Social History   Socioeconomic History   Marital status: Married    Spouse name: Not on file   Number of children: Not on file   Years of education:  Not on file   Highest education level: Not on file  Occupational History   Not on file  Tobacco Use   Smoking status: Every Day    Current packs/day: 0.50    Average packs/day: 0.5 packs/day for 10.0 years (5.0 ttl pk-yrs)    Types: Cigarettes   Smokeless tobacco: Never  Vaping Use   Vaping status: Never Used  Substance and Sexual Activity   Alcohol use: Yes    Alcohol/week: 0.0 - 4.0 standard drinks of alcohol   Drug use: No   Sexual activity: Yes    Partners: Male    Birth control/protection: I.U.D.  Other Topics Concern   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[facility]   Married (second marriage)   No children   No pets   Enjoys volunteering and exercise but has not being doing    Social Drivers of Corporate investment banker Strain: Low Risk  (03/10/2024)   Received from [facility]   Overall Financial Resource Strain (CARDIA)    Difficulty of Paying Living Expenses: Not hard at all  Food Insecurity: No Food Insecurity (03/10/2024)   Received from [facility]  Hunger Vital Sign    Within the past 12 months, you worried that your food would run out before you got the money to buy more.: Never true    Within the past 12 months, the food you bought just didn't last and you didn't have money to get more.: Never true  Transportation Needs: No Transportation Needs (03/10/2024)   Received from [facility]   PRAPARE - Transportation    Lack of Transportation (Medical): No    Lack of Transportation (Non-Medical): No  Physical Activity: Not on file  Stress: Not on file  Social Connections: Not on file  Intimate Partner Violence: Not on file    Outpatient Medications Prior to Visit  Medication Sig Dispense Refill   buPROPion  (WELLBUTRIN  XL) 150 MG 24 hr tablet Take 1 tablet (150 mg total) by mouth daily. 90 tablet 0   Cholecalciferol (VITAMIN D3) 75 MCG (3000 UT) TABS Take 1 tablet by mouth daily at 6 (six) AM.     estradiol (VIVELLE-DOT) 0.05 MG/24HR patch 1  patch 2 (two) times a week.     eszopiclone  (LUNESTA ) 1 MG TABS tablet Take 1 tablet (1 mg total) by mouth at bedtime as needed for sleep. Take immediately before bedtime 30 tablet 2   levothyroxine  (SYNTHROID ) 175 MCG tablet Take 1 tablet (175 mcg total) by mouth daily. 90 tablet 0   progesterone (PROMETRIUM) 100 MG capsule Take 100 mg by mouth at bedtime.     sertraline  (ZOLOFT ) 100 MG tablet Take 1 tablet (100 mg total) by mouth daily. 90 tablet 0   No facility-administered medications prior to visit.    No Known Allergies  Review of Systems  Respiratory:  Positive for shortness of breath.        Objective:    Physical Exam Constitutional:      General: She is not in acute distress.    Appearance: Normal appearance. She is well-developed.  HENT:     Head: Normocephalic and atraumatic.     Right Ear: Tympanic membrane, ear canal and external ear normal.     Left Ear: Tympanic membrane, ear canal and external ear normal.  Eyes:     General: No scleral icterus. Neck:     Thyroid : No thyromegaly.  Cardiovascular:     Rate and Rhythm: Normal rate and regular rhythm.     Heart sounds: Normal heart sounds. No murmur heard. Pulmonary:     Effort: Pulmonary effort is normal. No respiratory distress.     Breath sounds: Normal breath sounds. No wheezing.  Musculoskeletal:     Cervical back: Neck supple.  Skin:    General: Skin is warm and dry.  Neurological:     Mental Status: She is alert and oriented to person, place, and time.  Psychiatric:        Mood and Affect: Mood normal.        Behavior: Behavior normal.        Thought Content: Thought content normal.        Judgment: Judgment normal.      BP 116/77 (BP Location: Right Arm, Patient Position: Sitting, Cuff Size: Normal)   Pulse 90   Temp 98.6 F (37 C) (Oral)   Resp 16   Ht 5' 3.5 (1.613 m)   Wt 177 lb (80.3 kg)   SpO2 100%   BMI 30.86 kg/m  Wt Readings from Last 3 Encounters:  06/16/24 177 lb (80.3 kg)   05/21/24 176 lb (79.8 kg)  04/21/24 179  lb (81.2 kg)       Assessment & Plan:   Problem List Items Addressed This Visit       Unprioritized   SOB (shortness of breath) - Primary   EKG tracing is personally reviewed.  EKG notes NSR.  No acute changes.   CTA performed to rule out PE.  Thankfully it is negative.  Emphysematous changes were noted on CT.  She continues to smoke.  Will give trial of albuterol .    She plans to get her flu shot at work.       Relevant Medications   albuterol  (VENTOLIN  HFA) 108 (90 Base) MCG/ACT inhaler   Other Relevant Orders   EKG 12-Lead (Completed)   CT Angio Chest W/Cm &/Or Wo Cm (Completed)    I am having Alice Thomas start on albuterol . I am also having her maintain her estradiol, progesterone, Vitamin D3, levothyroxine , eszopiclone , sertraline , and buPROPion .  Meds ordered this encounter  Medications   albuterol  (VENTOLIN  HFA) 108 (90 Base) MCG/ACT inhaler    Sig: Inhale 2 puffs into the lungs every 6 (six) hours as needed for wheezing or shortness of breath.    Dispense:  8 g    Refill:  0    Supervising Provider:   DOMENICA BLACKBIRD A [[id number]]

## 2024-06-16 NOTE — Patient Instructions (Signed)
 Please begin albuterol  inhaler 2 puffs every 6 hours as needed.  Send me an update in a week or two to let me know how you are doing.

## 2024-06-16 NOTE — Assessment & Plan Note (Addendum)
 New. EKG tracing is personally reviewed.  EKG notes NSR.  No acute changes.   CTA performed to rule out PE.  Thankfully it is negative.  Emphysematous changes were noted on CT.  She continues to smoke.  Will give trial of albuterol .    She plans to get her flu shot at work.

## 2024-06-18 ENCOUNTER — Ambulatory Visit: Admitting: Family

## 2024-06-18 VITALS — BP 116/70 | HR 96 | Temp 98.6°F | Resp 16 | Ht 63.5 in | Wt 177.1 lb

## 2024-06-18 DIAGNOSIS — R0602 Shortness of breath: Secondary | ICD-10-CM | POA: Diagnosis not present

## 2024-06-18 DIAGNOSIS — F419 Anxiety disorder, unspecified: Secondary | ICD-10-CM | POA: Diagnosis not present

## 2024-06-18 DIAGNOSIS — R5383 Other fatigue: Secondary | ICD-10-CM | POA: Diagnosis not present

## 2024-06-18 DIAGNOSIS — Z23 Encounter for immunization: Secondary | ICD-10-CM

## 2024-06-18 DIAGNOSIS — F32A Depression, unspecified: Secondary | ICD-10-CM

## 2024-06-18 DIAGNOSIS — E039 Hypothyroidism, unspecified: Secondary | ICD-10-CM | POA: Diagnosis not present

## 2024-06-18 DIAGNOSIS — G4700 Insomnia, unspecified: Secondary | ICD-10-CM

## 2024-06-18 LAB — CBC WITH DIFFERENTIAL/PLATELET
Absolute Lymphocytes: 1514 {cells}/uL (ref 850–3900)
Absolute Monocytes: 496 {cells}/uL (ref 200–950)
Basophils Absolute: 40 {cells}/uL (ref 0–200)
Basophils Relative: 0.6 %
Eosinophils Absolute: 20 {cells}/uL (ref 15–500)
Eosinophils Relative: 0.3 %
HCT: 44.3 % (ref 35.0–45.0)
Hemoglobin: 14.4 g/dL (ref 11.7–15.5)
MCH: 30 pg (ref 27.0–33.0)
MCHC: 32.5 g/dL (ref 32.0–36.0)
MCV: 92.3 fL (ref 80.0–100.0)
MPV: 10 fL (ref 7.5–12.5)
Monocytes Relative: 7.4 %
Neutro Abs: 4630 {cells}/uL (ref 1500–7800)
Neutrophils Relative %: 69.1 %
Platelets: 269 Thousand/uL (ref 140–400)
RBC: 4.8 Million/uL (ref 3.80–5.10)
RDW: 12.2 % (ref 11.0–15.0)
Total Lymphocyte: 22.6 %
WBC: 6.7 Thousand/uL (ref 3.8–10.8)

## 2024-06-18 LAB — IRON,TIBC AND FERRITIN PANEL
%SAT: 20 % (ref 16–45)
Ferritin: 85 ng/mL (ref 16–232)
Iron: 64 ug/dL (ref 45–160)
TIBC: 315 ug/dL (ref 250–450)

## 2024-06-18 LAB — VITAMIN B12: Vitamin B-12: 488 pg/mL (ref 200–1100)

## 2024-06-18 LAB — LIPID PANEL
Cholesterol: 292 mg/dL — ABNORMAL HIGH (ref <200)
HDL: 74 mg/dL (ref >=50)
LDL Cholesterol (Calc): 200 mg/dL — ABNORMAL HIGH
Non-HDL Cholesterol (Calc): 218 mg/dL — ABNORMAL HIGH (ref <130)
Total CHOL/HDL Ratio: 3.9 (calc) (ref <5.0)
Triglycerides: 79 mg/dL (ref <150)

## 2024-06-18 LAB — TSH: TSH: 3.87 m[IU]/L

## 2024-06-18 MED ORDER — SERTRALINE HCL 100 MG PO TABS: 150.0000 mg | ORAL_TABLET | Freq: Every day | ORAL | 0 refills | Status: DC

## 2024-06-18 NOTE — Assessment & Plan Note (Signed)
 Uncontrolled.  Increase zoloft  from 100 to 150 mg.  Continues wellbutrin .

## 2024-06-18 NOTE — Patient Instructions (Signed)
 VISIT SUMMARY:  You visited us  today due to persistent shortness of breath, and we discussed your ongoing issues with anxiety, depression, and hypothyroidism. We also reviewed your general health maintenance needs.  YOUR PLAN:  SHORTNESS OF BREATH WITH UNDERLYING EMPHYSEMA AND TOBACCO USE DISORDER: Your shortness of breath continues despite a negative CT angiogram and partial relief from albuterol . Emphysema was noted on your CT scan, and smoking is a contributing factor. -We will refer you to a pulmonologist for pulmonary function tests. -We will refer you to a cardiologist for a stress test. -We discussed smoking cessation options, including nicotine patches.  DEPRESSION AND ANXIETY DISORDER: Your depression and anxiety persist despite being on Wellbutrin , and your PHQ-9 score has worsened. -We will increase your sertraline  dosage to 150 mg. -Please follow up in one month for a medication adjustment.  HYPOTHYROIDISM: You are currently taking levothyroxine  175 mcg daily, and your last TSH test was in July. -We will order a TSH test to check your thyroid  levels.  GENERAL HEALTH MAINTENANCE: We discussed routine health maintenance, including vaccinations and screening tests. -You will receive a flu shot today. -We will schedule a mammogram for you. -We will schedule a colonoscopy for you.

## 2024-06-18 NOTE — Assessment & Plan Note (Addendum)
 Improving, continue lunesta .  Contract up to date.

## 2024-06-18 NOTE — Progress Notes (Signed)
 Subjective:     Patient ID: Alice Thomas, female    DOB: 1971/03/04, 53 y.o.   MRN: [id number]  Chief Complaint  Patient presents with   Hypothyroidism    Here for follow up, needs TSH   Depression    Here for follo wup    Depression         Discussed the use of AI scribe software for clinical note transcription with the patient, who gave verbal consent to proceed.  History of Present Illness  History of Present Illness  Alice Thomas is a 53 year old female who presents with shortness of breath.  She experiences persistent shortness of breath, with a recent CT angiogram negative for pulmonary embolism. Albuterol  provides partial relief. Her shortness of breath is persistent and annoying.  She has anxiety and depression. Wellbutrin  recently added to sertraline . Despite smoking less, she still feels depressed and lacks enthusiasm for activities. She sleeps a lot and finds it difficult to admit feeling depressed.  She takes levothyroxine  175 mcg daily for hypothyroidism, increased from 150 mcg after her last TSH test in July. She is due for a TSH recheck.  She smokes half a pack to a pack of cigarettes daily, finding the ritualistic aspect challenging to quit. She sleeps in a separate room from her husband and falls asleep more easily now.     06/18/2024    3:10 PM 06/16/2024    8:08 AM 05/21/2024    3:08 PM 04/21/2024    3:17 PM  GAD 7 : Generalized Anxiety Score  Nervous, Anxious, on Edge 3 3 3 2   Control/stop worrying  2 3 1   Worry too much - different things  2 3 1   Trouble relaxing 3 3 3 3   Restless 3 2 3 3   Easily annoyed or irritable 3 3 3 3   Afraid - awful might happen 0 0 0 0  Total GAD 7 Score  15 18 13   Anxiety Difficulty Extremely difficult Extremely difficult Extremely difficult Very difficult       06/18/2024    3:10 PM 06/16/2024    8:08 AM 05/21/2024    3:08 PM  PHQ9 SCORE ONLY  PHQ-9 Total Score 20 16 18         Health Maintenance Due  Topic  Date Due   Colonoscopy  Never done   Mammogram  12/19/2022   COVID-19 Vaccine (3 - 2025-26 season) 05/24/2024   Hepatitis B Vaccines 19-59 Average Risk (2 of 2 - CpG 2-dose series) 06/18/2024    Past Medical History:  Diagnosis Date   Allergy    seasonal   Anemia    History of chicken pox    childhood   History of kidney stones    Thyroid  disease    hypothyroidism    Past Surgical History:  Procedure Laterality Date   ENDOMETRIAL BIOPSY     HERNIA REPAIR     19 months of age ? abdominal   INCISION AND DRAINAGE RETROPHYARYNGEAL ABCESS  1992 / 93   KNEE ARTHROSCOPY WITH ANTERIOR CRUCIATE LIGAMENT (ACL) REPAIR Right 09/23/2004   TONSILLECTOMY AND ADENOIDECTOMY     childhood   WISDOM TOOTH EXTRACTION  1992 / 93   this was complicated by a post operative abscess which required hospitalization/ICU care x 9 days- during college    Family History  Problem Relation Age of Onset   Crohn's disease Father    Alzheimer's disease Father    Parkinson's disease Father    Hypothyroidism  Sister    Heart disease Paternal Grandfather    Breast cancer Paternal Aunt     Social History   Socioeconomic History   Marital status: Married    Spouse name: Not on file   Number of children: Not on file   Years of education: Not on file   Highest education level: Not on file  Occupational History   Not on file  Tobacco Use   Smoking status: Every Day    Current packs/day: 0.50    Average packs/day: 0.5 packs/day for 10.0 years (5.0 ttl pk-yrs)    Types: Cigarettes   Smokeless tobacco: Never  Vaping Use   Vaping status: Never Used  Substance and Sexual Activity   Alcohol use: Yes    Alcohol/week: 0.0 - 4.0 standard drinks of alcohol   Drug use: No   Sexual activity: Yes    Partners: Male    Birth control/protection: I.U.D.  Other Topics Concern   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[facility]   Married (second marriage)   No children   No pets   Enjoys volunteering and  exercise but has not being doing    Social Drivers of Corporate investment banker Strain: Low Risk  (03/10/2024)   Received from [facility]   Overall Financial Resource Strain (CARDIA)    Difficulty of Paying Living Expenses: Not hard at all  Food Insecurity: No Food Insecurity (03/10/2024)   Received from [facility]   Hunger Vital Sign    Within the past 12 months, you worried that your food would run out before you got the money to buy more.: Never true    Within the past 12 months, the food you bought just didn't last and you didn't have money to get more.: Never true  Transportation Needs: No Transportation Needs (03/10/2024)   Received from [facility]    Lack of Transportation (Medical): No    Lack of Transportation (Non-Medical): No  Physical Activity: Not on file  Stress: Not on file  Social Connections: Not on file  Intimate Partner Violence: Not on file    Outpatient Medications Prior to Visit  Medication Sig Dispense Refill   albuterol  (VENTOLIN  HFA) 108 (90 Base) MCG/ACT inhaler Inhale 2 puffs into the lungs every 6 (six) hours as needed for wheezing or shortness of breath. 8 g 0   buPROPion  (WELLBUTRIN  XL) 150 MG 24 hr tablet Take 1 tablet (150 mg total) by mouth daily. 90 tablet 0   Cholecalciferol (VITAMIN D3) 75 MCG (3000 UT) TABS Take 1 tablet by mouth daily at 6 (six) AM.     estradiol (VIVELLE-DOT) 0.05 MG/24HR patch 1 patch 2 (two) times a week.     eszopiclone  (LUNESTA ) 1 MG TABS tablet Take 1 tablet (1 mg total) by mouth at bedtime as needed for sleep. Take immediately before bedtime 30 tablet 2   levothyroxine  (SYNTHROID ) 175 MCG tablet Take 1 tablet (175 mcg total) by mouth daily. 90 tablet 0   progesterone (PROMETRIUM) 100 MG capsule Take 100 mg by mouth at bedtime.     sertraline  (ZOLOFT ) 100 MG tablet Take 1 tablet (100 mg total) by mouth daily. 90 tablet 0   No facility-administered medications prior to visit.    No  Known Allergies  Review of Systems  Psychiatric/Behavioral:  Positive for depression.       See HPI Objective:    Physical Exam Constitutional:  General: She is not in acute distress.    Appearance: Normal appearance. She is well-developed.  HENT:     Head: Normocephalic and atraumatic.     Right Ear: External ear normal.     Left Ear: External ear normal.  Eyes:     General: No scleral icterus. Neck:     Thyroid : No thyromegaly.  Cardiovascular:     Rate and Rhythm: Normal rate and regular rhythm.     Heart sounds: Normal heart sounds. No murmur heard. Pulmonary:     Effort: No respiratory distress.     Breath sounds: Normal breath sounds. No wheezing.     Comments: Appears mildly short of breath Musculoskeletal:     Cervical back: Neck supple.  Skin:    General: Skin is warm and dry.  Neurological:     Mental Status: She is alert and oriented to person, place, and time.  Psychiatric:        Mood and Affect: Mood normal.        Behavior: Behavior normal.        Thought Content: Thought content normal.        Judgment: Judgment normal.      BP 116/70 (BP Location: Right Arm, Patient Position: Sitting, Cuff Size: Normal)   Pulse 96   Temp 98.6 F (37 C) (Oral)   Resp 16   Ht 5' 3.5 (1.613 m)   Wt 177 lb 1.6 oz (80.3 kg)   SpO2 98%   BMI 30.88 kg/m  Wt Readings from Last 3 Encounters:  06/18/24 177 lb 1.6 oz (80.3 kg)  06/16/24 177 lb (80.3 kg)  05/21/24 176 lb (79.8 kg)       Assessment & Plan:   Problem List Items Addressed This Visit       Unprioritized   SOB (shortness of breath) - Primary    Shortness of breath persists despite negative CT angio for PE and albuterol  use. Emphysema noted on CT. Differential includes COPD. Smoking contributes. - Refer to pulmonology for pulmonary function tests. - Refer to cardiology for stress test. - Discuss smoking cessation options, including nicotine patches/chantix .      Relevant Orders    Ambulatory referral to Cardiology   Ambulatory referral to Pulmonology   CBC w/Diff   Insomnia   Improving, continue lunesta .  Contract up to date.      Hypothyroidism   Lab Results  Component Value Date   TSH 5.16 (H) 04/21/2024         Anxiety and depression   Uncontrolled.  Increase zoloft  from 100 to 150 mg.  Continues wellbutrin .        Relevant Medications   sertraline  (ZOLOFT ) 100 MG tablet   Other Visit Diagnoses       Fatigue, unspecified type       Relevant Orders   TSH   B12   Lipid panel   Iron, TIBC and Ferritin Panel   CBC w/Diff     Needs flu shot       Relevant Orders   Flu vaccine trivalent PF, 6mos and older(Flulaval,Afluria,Fluarix,Fluzone) (Completed)       I have changed Kasarah Ellwanger's sertraline . I am also having her maintain her estradiol, progesterone, Vitamin D3, levothyroxine , eszopiclone , buPROPion , and albuterol .  Meds ordered this encounter  Medications   sertraline  (ZOLOFT ) 100 MG tablet    Sig: Take 1.5 tablets (150 mg total) by mouth daily.    Dispense:  135 tablet    Refill:  0    Supervising Provider:   DOMENICA BLACKBIRD A [phone]

## 2024-06-18 NOTE — Assessment & Plan Note (Signed)
  Shortness of breath persists despite negative CT angio for PE and albuterol  use. Emphysema noted on CT. Differential includes COPD. Smoking contributes. - Refer to pulmonology for pulmonary function tests. - Refer to cardiology for stress test. - Discuss smoking cessation options, including nicotine patches/chantix .

## 2024-06-18 NOTE — Assessment & Plan Note (Signed)
 Lab Results  Component Value Date   TSH 5.16 (H) 04/21/2024

## 2024-06-21 ENCOUNTER — Ambulatory Visit: Payer: Self-pay | Admitting: Family

## 2024-06-21 DIAGNOSIS — E785 Hyperlipidemia, unspecified: Secondary | ICD-10-CM

## 2024-06-21 NOTE — Telephone Encounter (Signed)
 Cholesterol is super high.  I would recommend that she begin atorvastatin to lower her cholesterol and decrease her chance of heart attack and stroke in addition to quitting smoking. Please send to pharmacy of her choice.

## 2024-06-23 MED ORDER — ATORVASTATIN CALCIUM 20 MG PO TABS: 20.0000 mg | ORAL_TABLET | Freq: Every day | ORAL | 1 refills | Status: AC

## 2024-06-23 NOTE — Telephone Encounter (Signed)
 Patient notified of results, provider's recommendations and new prescription. She verbalized understanding

## 2024-07-06 ENCOUNTER — Other Ambulatory Visit: Payer: Self-pay | Admitting: Family

## 2024-07-06 DIAGNOSIS — G4700 Insomnia, unspecified: Secondary | ICD-10-CM

## 2024-07-06 NOTE — Telephone Encounter (Signed)
 Requesting: Lunesta  1mg  Contract: 04/21/24 UDS: 04/21/24 Last Visit: 06/18/24 Next Visit: 07/23/24 Last Refill: 05/21/24 #30 and 2RF   Please Advise

## 2024-07-22 ENCOUNTER — Encounter: Payer: Self-pay | Admitting: Cardiology

## 2024-07-22 ENCOUNTER — Ambulatory Visit: Attending: Cardiology | Admitting: Cardiology

## 2024-07-22 VITALS — BP 110/84 | HR 108 | Ht 63.5 in | Wt 170.0 lb

## 2024-07-22 DIAGNOSIS — Z720 Tobacco use: Secondary | ICD-10-CM

## 2024-07-22 DIAGNOSIS — R0602 Shortness of breath: Secondary | ICD-10-CM | POA: Diagnosis not present

## 2024-07-22 DIAGNOSIS — E785 Hyperlipidemia, unspecified: Secondary | ICD-10-CM | POA: Diagnosis not present

## 2024-07-22 DIAGNOSIS — Z136 Encounter for screening for cardiovascular disorders: Secondary | ICD-10-CM

## 2024-07-22 DIAGNOSIS — R072 Precordial pain: Secondary | ICD-10-CM

## 2024-07-22 MED ORDER — METOPROLOL TARTRATE 100 MG PO TABS: ORAL_TABLET | ORAL | 0 refills | Status: DC

## 2024-07-22 MED ORDER — ASPIRIN 81 MG PO TBEC: 81.0000 mg | DELAYED_RELEASE_TABLET | Freq: Every day | ORAL | Status: AC

## 2024-07-22 NOTE — Progress Notes (Signed)
 Cardiology Consultation:    Date:  07/22/2024   ID:  Alice Thomas, DOB [birth year], MRN [id number]  PCP:  Daryl Setter, NP  Cardiologist:  Lamar Fitch, MD   Referring MD: Daryl Setter, NP   Chief Complaint  Patient presents with   Shortness of Breath    History of Present Illness:    Alice Thomas is a 53 y.o. female who is being seen today for the evaluation of shortness of breath at the request of Daryl Setter, NP.  Past medical history significant for smoking for many years, some anxiety, dyslipidemia with very high LDL 200 to last measurements, she was referred to us  because of shortness of breath it somewhat difficult to get started from her she tells me that shortness of breath usually happen at rest when she is sitting she feels like she is lacking air but then on more precise questioning we did find out when she bring groceries to her home she gets more shortness of breath than before and is gradually getting worse.  Sometimes uneasy sensation at the same time.  But again the story is very difficult to get out of her.  She does not exercise on the regular basis she still continues to smoke she is very anxious and nervous during the visit.  Denies have any palpitations no swelling of lower extremities.  She does not have family history of coronary artery disease her grandfather apparently did have some heart trouble but she was old when he passed, she also does not exercise she is not on any special diet  Past Medical History:  Diagnosis Date   Allergy    seasonal   Anemia    History of chicken pox    childhood   History of kidney stones    Thyroid  disease    hypothyroidism    Past Surgical History:  Procedure Laterality Date   ENDOMETRIAL BIOPSY     HERNIA REPAIR     73 months of age ? abdominal   INCISION AND DRAINAGE RETROPHYARYNGEAL ABCESS  1992 / 93   KNEE ARTHROSCOPY WITH ANTERIOR CRUCIATE LIGAMENT (ACL) REPAIR Right 09/23/2004    TONSILLECTOMY AND ADENOIDECTOMY     childhood   WISDOM TOOTH EXTRACTION  1992 / 93   this was complicated by a post operative abscess which required hospitalization/ICU care x 9 days- during college    Current Medications: Current Meds  Medication Sig   albuterol  (VENTOLIN  HFA) 108 (90 Base) MCG/ACT inhaler Inhale 2 puffs into the lungs every 6 (six) hours as needed for wheezing or shortness of breath.   atorvastatin (LIPITOR) 20 MG tablet Take 1 tablet (20 mg total) by mouth daily.   buPROPion  (WELLBUTRIN  XL) 150 MG 24 hr tablet Take 1 tablet (150 mg total) by mouth daily.   Cholecalciferol (VITAMIN D3) 75 MCG (3000 UT) TABS Take 1 tablet by mouth daily at 6 (six) AM.   estradiol (VIVELLE-DOT) 0.05 MG/24HR patch 1 patch 2 (two) times a week.   eszopiclone  (LUNESTA ) 1 MG TABS tablet Take 1 tablet (1 mg total) by mouth at bedtime as needed for sleep. Take immediately before bedtime   levothyroxine  (SYNTHROID ) 175 MCG tablet Take 1 tablet (175 mcg total) by mouth daily.   progesterone (PROMETRIUM) 100 MG capsule Take 100 mg by mouth at bedtime.   sertraline  (ZOLOFT ) 100 MG tablet Take 1.5 tablets (150 mg total) by mouth daily.     Allergies:   Patient has no known allergies.   Social History  Socioeconomic History   Marital status: Married    Spouse name: Not on file   Number of children: Not on file   Years of education: Not on file   Highest education level: Not on file  Occupational History   Not on file  Tobacco Use   Smoking status: Every Day    Current packs/day: 0.50    Average packs/day: 0.5 packs/day for 10.0 years (5.0 ttl pk-yrs)    Types: Cigarettes   Smokeless tobacco: Never  Vaping Use   Vaping status: Never Used  Substance and Sexual Activity   Alcohol use: Yes    Alcohol/week: 0.0 - 4.0 standard drinks of alcohol   Drug use: No   Sexual activity: Yes    Partners: Male    Birth control/protection: I.U.D.  Other Topics Concern   Not on file  Social  History Narrative   Rep for [facility]   Married (second marriage)   No children   No pets   Enjoys volunteering and exercise but has not being doing    Social Drivers of Corporate Investment Banker Strain: Low Risk  (03/10/2024)   Received from [facility]   Overall Financial Resource Strain (CARDIA)    Difficulty of Paying Living Expenses: Not hard at all  Food Insecurity: No Food Insecurity (03/10/2024)   Received from [facility]   Hunger Vital Sign    Within the past 12 months, you worried that your food would run out before you got the money to buy more.: Never true    Within the past 12 months, the food you bought just didn't last and you didn't have money to get more.: Never true  Transportation Needs: No Transportation Needs (03/10/2024)   Received from [facility] - Transportation    Lack of Transportation (Medical): No    Lack of Transportation (Non-Medical): No  Physical Activity: Not on file  Stress: Not on file  Social Connections: Not on file     Family History: The patient's family history includes Alzheimer's disease in her father; Breast cancer in her paternal aunt; Crohn's disease in her father; Heart disease in her paternal grandfather; Hypothyroidism in her sister; Parkinson's disease in her father. ROS:   Please see the history of present illness.    All 14 point review of systems negative except as described per history of present illness.  EKGs/Labs/Other Studies Reviewed:    The following studies were reviewed today:   EKG:  EKG Interpretation Date/Time:  Thursday July 22 2024 09:42:30 EDT Ventricular Rate:  108 PR Interval:  148 QRS Duration:  76 QT Interval:  356 QTC Calculation: 477 R Axis:   88  Text Interpretation: Sinus tachycardia Low voltage QRS No previous ECGs available Confirmed by Bernie Charleston [phone]) on 07/22/2024 9:44:32 AM    Recent Labs: 04/21/2024: ALT 13; BUN 15; Creat 0.76; Potassium 4.1; Sodium  139 06/18/2024: Hemoglobin 14.4; Platelets 269; TSH 3.87  Recent Lipid Panel    Component Value Date/Time   CHOL 292 (H) 06/18/2024 1551   TRIG 79 06/18/2024 1551   HDL 74 06/18/2024 1551   CHOLHDL 3.9 06/18/2024 1551   VLDL 15 05/21/2017 1011   LDLCALC 200 (H) 06/18/2024 1551    Physical Exam:    VS:  BP 110/84   Pulse (!) 108   Ht 5' 3.5 (1.613 m)   Wt 170 lb (77.1 kg)   SpO2 97%   BMI 29.64 kg/m     Wt Readings  from Last 3 Encounters:  07/22/24 170 lb (77.1 kg)  06/18/24 177 lb 1.6 oz (80.3 kg)  06/16/24 177 lb (80.3 kg)     GEN:  Well nourished, well developed in no acute distress HEENT: Normal NECK: No JVD; No carotid bruits LYMPHATICS: No lymphadenopathy CARDIAC: RRR, no murmurs, no rubs, no gallops RESPIRATORY:  Clear to auscultation without rales, wheezing or rhonchi  ABDOMEN: Soft, non-tender, non-distended MUSCULOSKELETAL:  No edema; No deformity  SKIN: Warm and dry NEUROLOGIC:  Alert and oriented x 3 PSYCHIATRIC:  Normal affect   ASSESSMENT:    1. SOB (shortness of breath)   2. Screening for cardiovascular condition   3. Tobacco abuse   4. Dyslipidemia    PLAN:    In order of problems listed above:  Shortness of breath probably multifactorial CT of her chest showed already emphysema she does have scheduled pulmonary function test which I think is an excellent idea however we need to make sure that is not related to heart condition.  And she does have very significant risk factors for having heart problems namely smoking which is still ongoing, dyslipidemia which is just recently started being managed.  Therefore, I will schedule to have echocardiogram to assess left ventricle ejection fraction.  She also will be scheduled to have coronary CT angio to see if she got coronary artery disease.  Ask her to start taking a baby aspirin every single day, Dyslipidemia I did review KPN which show me her LDL 200 HDL 74 and obviously very elevated LDL indicated  possibility of familial hyperlipidemia, she has no children, will ask her to start taking Lipitor 20 that she is being already given prescription for just simply did not start it yet.  Will encourage her to take 1 baby aspirin every single day. Emphysema probably COPD related to smoking we spent some time talking about need to quit she understands she will try to do that   Medication Adjustments/Labs and Tests Ordered: Current medicines are reviewed at length with the patient today.  Concerns regarding medicines are outlined above.  Orders Placed This Encounter  Procedures   EKG 12-Lead   No orders of the defined types were placed in this encounter.   Signed, Lamar DOROTHA Fitch, MD, [facility]. 07/22/2024 10:01 AM    [facility]

## 2024-07-22 NOTE — Addendum Note (Signed)
 Addended by: ARLOA PLANAS D on: 07/22/2024 10:11 AM   Modules accepted: Orders

## 2024-07-22 NOTE — Patient Instructions (Addendum)
 Medication Instructions:   START: Aspirin 81mg  1 tablet daily  Take:  Metoprolol 100mg  1 tablet 2 hour prior to CT Scan   Lab Work: None Ordered If you have labs (blood work) drawn today and your tests are completely normal, you will receive your results only by: MyChart Message (if you have MyChart) OR A paper copy in the mail If you have any lab test that is abnormal or we need to change your treatment, we will call you to review the results.   Testing/Procedures:  Your physician has requested that you have an echocardiogram. Echocardiography is a painless test that uses sound waves to create images of your heart. It provides your doctor with information about the size and shape of your heart and how well your heart's chambers and valves are working. This procedure takes approximately one hour. There are no restrictions for this procedure. Please do NOT wear cologne, perfume, aftershave, or lotions (deodorant is allowed). Please arrive 15 minutes prior to your appointment time.  Please note: We ask at that you not bring children with you during ultrasound (echo/ vascular) testing. Due to room size and safety concerns, children are not allowed in the ultrasound rooms during exams. Our front office staff cannot provide observation of children in our lobby area while testing is being conducted. An adult accompanying a patient to their appointment will only be allowed in the ultrasound room at the discretion of the ultrasound technician under special circumstances. We apologize for any inconvenience.   Your cardiac CT will be scheduled at one of the below locations:   [facility] [address] [place], [place] [address]  Please follow these instructions carefully (unless otherwise directed):     On the Night Before the Test: Be sure to Drink plenty of water. Do not consume any caffeinated/decaffeinated beverages or chocolate 12 hours prior to your test. Do not take any  antihistamines 12 hours prior to your test.   On the Day of the Test: Drink plenty of water until 1 hour prior to the test. Do not eat any food 4 hours prior to the test. You may take your regular medications prior to the test.  Take metoprolol (Lopressor) two hours prior to test. FEMALES- please wear underwire-free bra if available, avoid dresses & tight clothing       After the Test: Drink plenty of water. After receiving IV contrast, you may experience a mild flushed feeling. This is normal. On occasion, you may experience a mild rash up to 24 hours after the test. This is not dangerous. If this occurs, you can take Benadryl 25 mg and increase your fluid intake. If you experience trouble breathing, this can be serious. If it is severe call 911 IMMEDIATELY. If it is mild, please call our office. If you take any of these medications: Glipizide/Metformin, Avandament, Glucavance, please do not take 48 hours after completing test unless otherwise instructed.  We will call to schedule your test 2-4 weeks out understanding that some insurance companies will need an authorization prior to the service being performed.   For non-scheduling related questions, please contact the cardiac imaging nurse navigator should you have any questions/concerns: Camie Shutter, Cardiac Imaging Nurse Navigator Chantal Requena, Cardiac Imaging Nurse Navigator [facility] Direct Office Dial: [phone]   For scheduling needs, including cancellations and rescheduling, please call Brittany, [phone].    Follow-Up: At [facility], you and your health needs are our priority.  As part  of our continuing mission to provide you with exceptional heart care, we have created designated Provider Care Teams.  These Care Teams include your primary Cardiologist (physician) and Advanced Practice Providers (APPs -  Physician Assistants and Nurse Practitioners) who all work together to provide  you with the care you need, when you need it.  We recommend signing up for the patient portal called MyChart.  Sign up information is provided on this After Visit Summary.  MyChart is used to connect with patients for Virtual Visits (Telemedicine).  Patients are able to view lab/test results, encounter notes, upcoming appointments, etc.  Non-urgent messages can be sent to your provider as well.   To learn more about what you can do with MyChart, go to [email or web address].    Your next appointment:   2 month(s)  The format for your next appointment:   In Person  Provider:   Lamar Fitch, MD    Other Instructions NA

## 2024-07-23 ENCOUNTER — Ambulatory Visit: Admitting: Family

## 2024-07-30 ENCOUNTER — Encounter (HOSPITAL_COMMUNITY): Payer: Self-pay

## 2024-08-02 ENCOUNTER — Telehealth (HOSPITAL_COMMUNITY): Payer: Self-pay | Admitting: *Deleted

## 2024-08-02 ENCOUNTER — Telehealth (HOSPITAL_COMMUNITY): Payer: Self-pay | Admitting: Emergency Medicine

## 2024-08-02 NOTE — Telephone Encounter (Signed)
 Attempted to call patient regarding upcoming cardiac CT appointment. Left message on voicemail with name and callback number Rockwell Alexandria RN Navigator Cardiac Imaging [facility] [phone] Office [phone] Cell

## 2024-08-02 NOTE — Telephone Encounter (Signed)
Patient returning call about her upcoming cardiac imaging study; pt verbalizes understanding of appt date/time, parking situation and where to check in, pre-test NPO status and medications ordered, and verified current allergies; name and call back number provided for further questions should they arise  Daylyn Azbill RN Navigator Cardiac Imaging [facility] [phone] office [phone] cell  Patient to take 100mg metoprolol tartrate two hours prior to her cardiac CT scan. 

## 2024-08-03 ENCOUNTER — Encounter: Payer: Self-pay | Admitting: Pulmonary Disease

## 2024-08-03 ENCOUNTER — Ambulatory Visit: Admitting: Pulmonary Disease

## 2024-08-03 ENCOUNTER — Encounter (HOSPITAL_BASED_OUTPATIENT_CLINIC_OR_DEPARTMENT_OTHER): Payer: Self-pay

## 2024-08-03 ENCOUNTER — Ambulatory Visit (HOSPITAL_BASED_OUTPATIENT_CLINIC_OR_DEPARTMENT_OTHER)
Admission: RE | Admit: 2024-08-03 | Discharge: 2024-08-03 | Disposition: A | Discharge status: Home / Self Care | Source: Ambulatory Visit | Attending: Cardiology | Admitting: Cardiology

## 2024-08-03 ENCOUNTER — Ambulatory Visit: Payer: Self-pay | Admitting: Cardiology

## 2024-08-03 VITALS — BP 113/78 | HR 69 | Temp 98.3°F | Ht 63.5 in | Wt 174.6 lb

## 2024-08-03 DIAGNOSIS — R0683 Snoring: Secondary | ICD-10-CM | POA: Diagnosis not present

## 2024-08-03 DIAGNOSIS — G478 Other sleep disorders: Secondary | ICD-10-CM | POA: Diagnosis not present

## 2024-08-03 DIAGNOSIS — R0602 Shortness of breath: Secondary | ICD-10-CM

## 2024-08-03 DIAGNOSIS — R072 Precordial pain: Secondary | ICD-10-CM | POA: Insufficient documentation

## 2024-08-03 MED ORDER — NITROGLYCERIN 0.4 MG SL SUBL
0.8000 mg | SUBLINGUAL_TABLET | Freq: Once | SUBLINGUAL | Status: AC
  Administered 2024-08-03: 0.8 mg via SUBLINGUAL

## 2024-08-03 MED ORDER — IOHEXOL 350 MG/ML SOLN
100.0000 mL | Freq: Once | INTRAVENOUS | Status: AC | PRN
  Administered 2024-08-03: 95 mL via INTRAVENOUS

## 2024-08-03 MED ORDER — DILTIAZEM HCL 25 MG/5ML IV SOLN: 10.0000 mg | INTRAVENOUS | Status: DC | PRN

## 2024-08-03 MED ORDER — METOPROLOL TARTRATE 5 MG/5ML IV SOLN
10.0000 mg | Freq: Once | INTRAVENOUS | Status: DC | PRN
Start: 2024-08-03 — End: 2024-08-04

## 2024-08-03 NOTE — Progress Notes (Signed)
 Alice Thomas    [id number]    April 18, 1971  Primary Care Physician:O'Sullivan, Eleanor, NP  Referring Physician: Daryl Eleanor, NP [id number] [place],  [place] [address]  Chief complaint:   Patient being seen for shortness of breath  HPI:  She feels that sometimes she is not taking deep breaths or forgetting to breathe Been treated for anxiety for which she is on Zoloft  - This has helped some  An active smoker about half a pack a day  Has no underlying lung disease  Had a cardiac CT today in the workup of the shortness of breath  Used to be able to run and walk She does get short of breath  Was last seen in the office here for insomnia and also for concern for sleep disordered breathing  Still has significant snoring, no witnessed apneas Does not feel rested in the morning Tosses and turns a lot No significant dryness of the mouth in the morning No significant headaches in the morning Occasional night sweats  At that does have obstructive sleep apnea Does not feel to sleep sleepy during the day Her weight has been stable  Outpatient Encounter Medications as of 08/03/2024  Medication Sig   albuterol  (VENTOLIN  HFA) 108 (90 Base) MCG/ACT inhaler Inhale 2 puffs into the lungs every 6 (six) hours as needed for wheezing or shortness of breath.   aspirin EC 81 MG tablet Take 1 tablet (81 mg total) by mouth daily. Swallow whole.   atorvastatin (LIPITOR) 20 MG tablet Take 1 tablet (20 mg total) by mouth daily.   buPROPion  (WELLBUTRIN  XL) 150 MG 24 hr tablet Take 1 tablet (150 mg total) by mouth daily.   Cholecalciferol (VITAMIN D3) 75 MCG (3000 UT) TABS Take 1 tablet by mouth daily at 6 (six) AM.   estradiol (VIVELLE-DOT) 0.05 MG/24HR patch 1 patch 2 (two) times a week.   eszopiclone  (LUNESTA ) 1 MG TABS tablet Take 1 tablet (1 mg total) by mouth at bedtime as needed for sleep. Take immediately before bedtime   levothyroxine  (SYNTHROID ) 175 MCG  tablet Take 1 tablet (175 mcg total) by mouth daily.   progesterone (PROMETRIUM) 100 MG capsule Take 100 mg by mouth at bedtime.   sertraline  (ZOLOFT ) 100 MG tablet Take 1.5 tablets (150 mg total) by mouth daily.   metoprolol tartrate (LOPRESSOR) 100 MG tablet Take one tablet 2 hours before cardiac CT for heart greater than 55 (Patient not taking: Reported on 08/03/2024)   Facility-Administered Encounter Medications as of 08/03/2024  Medication   diltiazem (CARDIZEM) injection 10 mg   metoprolol tartrate (LOPRESSOR) injection 10 mg    Allergies as of 08/03/2024   (No Known Allergies)    Past Medical History:  Diagnosis Date   Allergy    seasonal   Anemia    History of chicken pox    childhood   History of kidney stones    Thyroid  disease    hypothyroidism    Past Surgical History:  Procedure Laterality Date   ENDOMETRIAL BIOPSY     HERNIA REPAIR     24 months of age ? abdominal   INCISION AND DRAINAGE RETROPHYARYNGEAL ABCESS  1992 / 93   KNEE ARTHROSCOPY WITH ANTERIOR CRUCIATE LIGAMENT (ACL) REPAIR Right 09/23/2004   TONSILLECTOMY AND ADENOIDECTOMY     childhood   WISDOM TOOTH EXTRACTION  1992 / 93   this was complicated by a post operative abscess which required hospitalization/ICU care  x 9 days- during college    Family History  Problem Relation Age of Onset   Crohn's disease Father    Alzheimer's disease Father    Parkinson's disease Father    Hypothyroidism Sister    Heart disease Paternal Grandfather    Breast cancer Paternal Aunt     Social History   Socioeconomic History   Marital status: Married    Spouse name: Not on file   Number of children: Not on file   Years of education: Not on file   Highest education level: Not on file  Occupational History   Not on file  Tobacco Use   Smoking status: Every Day    Current packs/day: 0.50    Average packs/day: 0.5 packs/day for 10.0 years (5.0 ttl pk-yrs)    Types: Cigarettes   Smokeless tobacco: Never   Vaping Use   Vaping status: Never Used  Substance and Sexual Activity   Alcohol use: Yes    Alcohol/week: 0.0 - 4.0 standard drinks of alcohol   Drug use: No   Sexual activity: Yes    Partners: Male    Birth control/protection: I.U.D.  Other Topics Concern   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[facility]   Married (second marriage)   No children   No pets   Enjoys volunteering and exercise but has not being doing    Social Drivers of Corporate Investment Banker Strain: Low Risk  (03/10/2024)   Received from [facility]   Overall Financial Resource Strain (CARDIA)    Difficulty of Paying Living Expenses: Not hard at all  Food Insecurity: No Food Insecurity (03/10/2024)   Received [facility]   Hunger Vital Sign    Within the past 12 months, you worried that your food would run out before you got the money to buy more.: Never true    Within the past 12 months, the food you bought just didn't last and you didn't have money to get more.: Never true  Transportation Needs: No Transportation Needs (03/10/2024)   Received from [facility]    Lack of Transportation (Medical): No    Lack of Transportation (Non-Medical): No  Physical Activity: Not on file  Stress: Not on file  Social Connections: Not on file  Intimate Partner Violence: Not on file    Review of Systems  Constitutional:  Positive for fatigue.  Respiratory:  Positive for shortness of breath.   Psychiatric/Behavioral:  Positive for sleep disturbance.     Vitals:   08/03/24 1259  BP: 113/78  Pulse: 69  Temp: 98.3 F (36.8 C)  SpO2: 97%     Physical Exam Constitutional:      Appearance: Normal appearance.  HENT:     Head: Normocephalic.     Nose: Nose normal.     Mouth/Throat:     Mouth: Mucous membranes are moist.  Eyes:     General: No scleral icterus. Cardiovascular:     Rate and Rhythm: Normal rate and regular rhythm.     Heart sounds: No murmur  heard.    No friction rub.  Pulmonary:     Effort: No respiratory distress.     Breath sounds: No stridor. No wheezing or rhonchi.  Musculoskeletal:     Cervical back: Normal range of motion. No rigidity or tenderness.  Neurological:     Mental Status: She is alert.     Cranial Nerves: No cranial nerve  deficit.  Psychiatric:        Mood and Affect: Mood normal.    Epworth Sleepiness Scale of 0 Data Reviewed: No previous sleep study on record  Cardiac CT was reviewed showing no significant abnormality-reviewed by myself  CT chest 06/16/2024 shows evidence of centrilobular emphysema  Assessment/Plan: Shortness of breath  Active smoker  Concern for sleep disordered breathing - Nonrestorative sleep - No significant daytime sleepiness  Anxiety -Currently on Zoloft    Schedule for pulmonary function test  Schedule for home sleep apnea testing  May use albuterol  as needed  Continue to work on stabilizing anxiety  Depending on pulmonary function test if significant obstructive disease, may benefit from inhalers  Counseled about the need to quit smoking  Follow-up in about 8 weeks      Jennet Epley MD [facility] 08/03/2024, 1:27 PM  CC: Daryl Setter, NP

## 2024-08-03 NOTE — Progress Notes (Signed)
 Patient tolerated CT well. Vital signs stable encourage to drink water throughout day.Reasons explained and verbalized understanding. Ambulated steady gait.

## 2024-08-03 NOTE — Patient Instructions (Addendum)
 We will get a breathing study on you  Graded exercises as tolerated  Try and cut down and possibly quit smoking if you can  We will schedule you for a sleep study to assess for sleep apnea  Follow-up in about 8 weeks  Call us  with significant concerns

## 2024-08-09 ENCOUNTER — Ambulatory Visit (HOSPITAL_BASED_OUTPATIENT_CLINIC_OR_DEPARTMENT_OTHER)
Admission: RE | Admit: 2024-08-09 | Discharge: 2024-08-09 | Disposition: A | Discharge status: Home / Self Care | Source: Ambulatory Visit | Attending: Cardiology | Admitting: Cardiology

## 2024-08-09 DIAGNOSIS — R0602 Shortness of breath: Secondary | ICD-10-CM | POA: Diagnosis present

## 2024-08-09 LAB — ECHOCARDIOGRAM COMPLETE
AR max vel: 2 cm2
AV Area VTI: 2.12 cm2
AV Area mean vel: 1.96 cm2
AV Mean grad: 5 mmHg
AV Peak grad: 9.5 mmHg
Ao pk vel: 1.55 m/s
Area-P 1/2: 4.96 cm2
Calc EF: 69.8 %
MV M vel: 3.67 m/s
MV Peak grad: 53.9 mmHg
S' Lateral: 2.4 cm
Single Plane A2C EF: 70.4 %
Single Plane A4C EF: 72.8 %

## 2024-08-16 NOTE — Telephone Encounter (Signed)
 Patient read MyChart message regarding echo results today, 08/16/24. Alan, RN

## 2024-08-17 ENCOUNTER — Encounter: Payer: Self-pay | Admitting: Cardiology

## 2024-08-20 ENCOUNTER — Other Ambulatory Visit: Payer: Self-pay | Admitting: Family

## 2024-08-20 DIAGNOSIS — F32A Depression, unspecified: Secondary | ICD-10-CM

## 2024-08-23 ENCOUNTER — Ambulatory Visit: Admitting: Family

## 2024-08-23 VITALS — BP 102/67 | HR 88 | Temp 98.3°F | Resp 16 | Ht 63.5 in | Wt 168.0 lb

## 2024-08-23 DIAGNOSIS — Z720 Tobacco use: Secondary | ICD-10-CM

## 2024-08-23 DIAGNOSIS — F32A Depression, unspecified: Secondary | ICD-10-CM

## 2024-08-23 DIAGNOSIS — F419 Anxiety disorder, unspecified: Secondary | ICD-10-CM

## 2024-08-23 DIAGNOSIS — G4700 Insomnia, unspecified: Secondary | ICD-10-CM

## 2024-08-23 DIAGNOSIS — E039 Hypothyroidism, unspecified: Secondary | ICD-10-CM

## 2024-08-23 DIAGNOSIS — R0602 Shortness of breath: Secondary | ICD-10-CM

## 2024-08-23 MED ORDER — BUPROPION HCL ER (XL) 150 MG PO TB24: 150.0000 mg | ORAL_TABLET | Freq: Every day | ORAL | 0 refills | Status: AC

## 2024-08-23 MED ORDER — SERTRALINE HCL 100 MG PO TABS: 150.0000 mg | ORAL_TABLET | Freq: Every day | ORAL | 0 refills | Status: DC

## 2024-08-23 MED ORDER — ESZOPICLONE 1 MG PO TABS: 1.0000 mg | ORAL_TABLET | Freq: Every evening | ORAL | 2 refills | Status: AC | PRN

## 2024-08-23 MED ORDER — LEVOTHYROXINE SODIUM 175 MCG PO TABS: 175.0000 ug | ORAL_TABLET | Freq: Every day | ORAL | 0 refills | Status: AC

## 2024-08-23 MED ORDER — VARENICLINE TARTRATE (STARTER) 0.5 MG X 11 & 1 MG X 42 PO TBPK: ORAL_TABLET | ORAL | 0 refills | Status: AC

## 2024-08-23 NOTE — Patient Instructions (Signed)
  VISIT SUMMARY: Today, we discussed your ongoing shortness of breath, depression, anxiety, insomnia, hypothyroidism, and tobacco use. We reviewed your recent tests and adjusted your medications as needed. You are making progress in some areas, and we have a plan to address the remaining issues.  YOUR PLAN: -SHORTNESS OF BREATH: You have been experiencing shortness of breath, especially in the afternoons and after work. We are waiting for the results of your pulmonary function tests and sleep study to better understand the cause.  -DEPRESSION AND ANXIETY: Your depression has improved with the increased dose of sertraline , and your anxiety is well-managed. Continue taking sertraline  and Wellbutrin  as prescribed.  -INSOMNIA: Your insomnia is being managed with eszopiclone . We have refilled your prescription for this medication.  -HYPOTHYROIDISM: Your hypothyroidism is well-managed with levothyroxine . Continue taking this medication as prescribed.  -TOBACCO USE DISORDER: You are reducing your smoking but have not quit entirely. We discussed using Chantix  to help you quit smoking. Chantix  can cause vivid dreams and has a warning for increased risk of suicidal thoughts. We have prescribed a Chantix  starter pack for you to begin at your convenience. Continue taking Wellbutrin  to support smoking cessation.  INSTRUCTIONS: Please complete your pulmonary function tests and sleep study as soon as possible. Continue taking your medications as prescribed. Start the Chantix  starter pack when you are ready and monitor for any side effects. Follow up with us  if you experience any new or worsening symptoms.

## 2024-08-23 NOTE — Assessment & Plan Note (Signed)
 Lab Results  Component Value Date   TSH 3.87 06/18/2024    Well-managed with levothyroxine . Last thyroid  function test in September was normal. - Continue levothyroxine  as prescribed.

## 2024-08-23 NOTE — Assessment & Plan Note (Signed)
  Continued tobacco use with some reduction. Discussed Chantix  for smoking cessation. Wellbutrin  aids in smoking cessation. Chantix  may cause vivid dreams and has a black box warning for increased risk of suicidal ideation. - Prescribed Chantix  starter pack for smoking cessation, to be started at her convenience. - Monitor for side effects and effectiveness of Chantix .

## 2024-08-23 NOTE — Assessment & Plan Note (Signed)
  Depression improved with increased sertraline  dosage. Anxiety well-managed. Wellbutrin  continued for depression and smoking cessation support. - Continue sertraline  at current dosage. - Continue Wellbutrin  for depression and smoking cessation.

## 2024-08-23 NOTE — Progress Notes (Signed)
 Subjective:     Patient ID: Alice Thomas, female    DOB: 14-Dec-1970, 53 y.o.   MRN: [id number]  Chief Complaint  Patient presents with   Anxiety    Here for follow up   Depression    Here for follow up    HPI  Discussed the use of AI scribe software for clinical note transcription with the patient, who gave verbal consent to proceed.  History of Present Illness Alice Thomas is a 53 year old female who presents with shortness of breath.  She experiences shortness of breath primarily in the afternoons after work or when she thinks about it. She has not yet completed the pulmonary function tests and sleep test. A CT of her coronary arteries and an echocardiogram were performed, both of which were normal. She continues to experience some difficulty breathing but notes improvement compared to previous episodes.  She has a history of anxiety and depression. Her anxiety is stable, but her depression was not well-controlled previously. She is currently taking Wellbutrin  and has had her sertraline  dose increased, which she feels is helping her depression. She does not wish to decrease the sertraline  as it is beneficial and not causing adverse effects.  She is a smoker and has been reducing her smoking, now smoking half a cigarette at a time. She has not quit smoking entirely and has not used Chantix  before.  She is currently taking Wellbutrin , sertraline , and Synthroid . She recently refilled her Wellbutrin  and requested a refill for Lunesta , which she lost previously. She also requested Valtrex  in preparation for the time of year.      Health Maintenance Due  Topic Date Due   Colonoscopy  Never done   Mammogram  12/19/2022   COVID-19 Vaccine (3 - 2025-26 season) 05/24/2024   Hepatitis B Vaccines 19-59 Average Risk (2 of 2 - CpG 2-dose series) 06/18/2024    Past Medical History:  Diagnosis Date   Allergy    seasonal   Anemia    History of chicken pox    childhood   History of  kidney stones    Thyroid  disease    hypothyroidism    Past Surgical History:  Procedure Laterality Date   ENDOMETRIAL BIOPSY     HERNIA REPAIR     78 months of age ? abdominal   INCISION AND DRAINAGE RETROPHYARYNGEAL ABCESS  1992 / 93   KNEE ARTHROSCOPY WITH ANTERIOR CRUCIATE LIGAMENT (ACL) REPAIR Right 09/23/2004   TONSILLECTOMY AND ADENOIDECTOMY     childhood   WISDOM TOOTH EXTRACTION  1992 / 93   this was complicated by a post operative abscess which required hospitalization/ICU care x 9 days- during college    Family History  Problem Relation Age of Onset   Crohn's disease Father    Alzheimer's disease Father    Parkinson's disease Father    Hypothyroidism Sister    Heart disease Paternal Grandfather    Breast cancer Paternal Aunt     Social History   Socioeconomic History   Marital status: Married    Spouse name: Not on file   Number of children: Not on file   Years of education: Not on file   Highest education level: Not on file  Occupational History   Not on file  Tobacco Use   Smoking status: Every Day    Current packs/day: 0.50    Average packs/day: 0.5 packs/day for 10.0 years (5.0 ttl pk-yrs)    Types: Cigarettes   Smokeless tobacco: Never  Vaping Use   Vaping status: Never Used  Substance and Sexual Activity   Alcohol use: Yes    Alcohol/week: 0.0 - 4.0 standard drinks of alcohol   Drug use: No   Sexual activity: Yes    Partners: Male    Birth control/protection: I.U.D.  Other Topics Concern   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[facility]   Married (second marriage)   No children   No pets   Enjoys volunteering and exercise but has not being doing    Social Drivers of Corporate Investment Banker Strain: Low Risk  (03/10/2024)   Received from [facility]   Overall Financial Resource Strain (CARDIA)    Difficulty of Paying Living Expenses: Not hard at all  Food Insecurity: No Food Insecurity (03/10/2024)   Received from [facility]   Hunger Vital Sign    Within the past 12 months, you worried that your food would run out before you got the money to buy more.: Never true    Within the past 12 months, the food you bought just didn't last and you didn't have money to get more.: Never true  Transportation Needs: No Transportation Needs (03/10/2024)   Received from [facility] - Transportation    Lack of Transportation (Medical): No    Lack of Transportation (Non-Medical): No  Physical Activity: Not on file  Stress: Not on file  Social Connections: Not on file  Intimate Partner Violence: Not on file    Outpatient Medications Prior to Visit  Medication Sig Dispense Refill   albuterol  (VENTOLIN  HFA) 108 (90 Base) MCG/ACT inhaler Inhale 2 puffs into the lungs every 6 (six) hours as needed for wheezing or shortness of breath. 8 g 0   aspirin  EC 81 MG tablet Take 1 tablet (81 mg total) by mouth daily. Swallow whole.     atorvastatin  (LIPITOR) 20 MG tablet Take 1 tablet (20 mg total) by mouth daily. 90 tablet 1   Cholecalciferol (VITAMIN D3) 75 MCG (3000 UT) TABS Take 1 tablet by mouth daily at 6 (six) AM.     estradiol (VIVELLE-DOT) 0.05 MG/24HR patch 1 patch 2 (two) times a week.     progesterone (PROMETRIUM) 100 MG capsule Take 100 mg by mouth at bedtime.     buPROPion  (WELLBUTRIN  XL) 150 MG 24 hr tablet TAKE 1 TABLET BY MOUTH EVERY DAY 90 tablet 0   eszopiclone  (LUNESTA ) 1 MG TABS tablet Take 1 tablet (1 mg total) by mouth at bedtime as needed for sleep. Take immediately before bedtime 30 tablet 2   levothyroxine  (SYNTHROID ) 175 MCG tablet Take 1 tablet (175 mcg total) by mouth daily. 90 tablet 0   sertraline  (ZOLOFT ) 100 MG tablet Take 1.5 tablets (150 mg total) by mouth daily. 135 tablet 0   metoprolol  tartrate (LOPRESSOR ) 100 MG tablet Take one tablet 2 hours before cardiac CT for heart greater than 55 (Patient not taking: Reported on 08/03/2024) 1 tablet 0   No facility-administered medications prior  to visit.    No Known Allergies  ROS    See HPI Objective:    Physical Exam   BP 102/67 (BP Location: Right Arm, Patient Position: Sitting, Cuff Size: Normal)   Pulse 88   Temp 98.3 F (36.8 C) (Oral)   Resp 16   Ht 5' 3.5 (1.613 m)   Wt 168 lb (76.2 kg)   SpO2 100%   BMI 29.29 kg/m  Wt Readings from  Last 3 Encounters:  08/23/24 168 lb (76.2 kg)  08/03/24 174 lb 9.6 oz (79.2 kg)  07/22/24 170 lb (77.1 kg)   Gen: Awake, alert, no acute distress Resp: Breathing is even and non-labored Psych: calm/pleasant demeanor Neuro: Alert and Oriented x 3, + facial symmetry, speech is clear.      Assessment & Plan:   Problem List Items Addressed This Visit       Unprioritized   Tobacco abuse - Primary    Continued tobacco use with some reduction. Discussed Chantix  for smoking cessation. Wellbutrin  aids in smoking cessation. Chantix  may cause vivid dreams and has a black box warning for increased risk of suicidal ideation. - Prescribed Chantix  starter pack for smoking cessation, to be started at her convenience. - Monitor for side effects and effectiveness of Chantix .       Relevant Medications   Varenicline  Tartrate, Starter, (CHANTIX  STARTING MONTH PAK) 0.5 MG X 11 & 1 MG X 42 TBPK   SOB (shortness of breath)    Intermittent shortness of breath, primarily in the afternoons and after work. Differential includes lung component such as emphysema, anxiety, or other factors. Cardiac work up reassuring. - Await results of pulmonary function tests and sleep study.       Insomnia    Managed with eszopiclone . - Refilled eszopiclone  prescription.      Relevant Medications   eszopiclone  (LUNESTA ) 1 MG TABS tablet   Hypothyroidism   Lab Results  Component Value Date   TSH 3.87 06/18/2024    Well-managed with levothyroxine . Last thyroid  function test in September was normal. - Continue levothyroxine  as prescribed.      Relevant Medications   levothyroxine   (SYNTHROID ) 175 MCG tablet   Anxiety and depression    Depression improved with increased sertraline  dosage. Anxiety well-managed. Wellbutrin  continued for depression and smoking cessation support. - Continue sertraline  at current dosage. - Continue Wellbutrin  for depression and smoking cessation.      Relevant Medications   buPROPion  (WELLBUTRIN  XL) 150 MG 24 hr tablet   sertraline  (ZOLOFT ) 100 MG tablet   Assessment & Plan     I have discontinued Alice Thomas's metoprolol  tartrate. I have also changed her buPROPion . Additionally, I am having her start on Varenicline  Tartrate (Starter). Lastly, I am having her maintain her estradiol, progesterone, Vitamin D3, albuterol , atorvastatin , aspirin  EC, eszopiclone , sertraline , and levothyroxine .  Meds ordered this encounter  Medications   Varenicline  Tartrate, Starter, (CHANTIX  STARTING MONTH PAK) 0.5 MG X 11 & 1 MG X 42 TBPK    Sig: Take one 0.5 mg tablet by mouth once daily for 3 days, then increase to one 0.5 mg tablet twice daily for 4 days, then increase to one 1 mg tablet twice daily.    Dispense:  53 each    Refill:  0    Supervising Provider:   DOMENICA BLACKBIRD A [[id number]]   buPROPion  (WELLBUTRIN  XL) 150 MG 24 hr tablet    Sig: Take 1 tablet (150 mg total) by mouth daily.    Dispense:  90 tablet    Refill:  0    Supervising Provider:   DOMENICA BLACKBIRD A [[id number]]   eszopiclone  (LUNESTA ) 1 MG TABS tablet    Sig: Take 1 tablet (1 mg total) by mouth at bedtime as needed for sleep. Take immediately before bedtime    Dispense:  30 tablet    Refill:  2    Supervising Provider:   DOMENICA BLACKBIRD A [[id number]]   sertraline  (ZOLOFT ) 100  MG tablet    Sig: Take 1.5 tablets (150 mg total) by mouth daily.    Dispense:  135 tablet    Refill:  0    Supervising Provider:   DOMENICA BLACKBIRD A [[id number]]   levothyroxine  (SYNTHROID ) 175 MCG tablet    Sig: Take 1 tablet (175 mcg total) by mouth daily.    Dispense:  90 tablet    Refill:  0    Supervising Provider:    DOMENICA BLACKBIRD A [[id number]]

## 2024-08-23 NOTE — Assessment & Plan Note (Signed)
  Intermittent shortness of breath, primarily in the afternoons and after work. Differential includes lung component such as emphysema, anxiety, or other factors. Cardiac work up reassuring. - Await results of pulmonary function tests and sleep study.

## 2024-08-23 NOTE — Assessment & Plan Note (Signed)
  Managed with eszopiclone . - Refilled eszopiclone  prescription.

## 2024-08-27 ENCOUNTER — Ambulatory Visit: Admitting: Family

## 2024-09-28 ENCOUNTER — Encounter: Payer: Self-pay | Admitting: Cardiology

## 2024-09-28 ENCOUNTER — Ambulatory Visit: Attending: Cardiology | Admitting: Cardiology

## 2024-09-28 VITALS — BP 110/74 | HR 76 | Ht 63.5 in | Wt 168.0 lb

## 2024-09-28 DIAGNOSIS — R0602 Shortness of breath: Secondary | ICD-10-CM

## 2024-09-28 DIAGNOSIS — Z720 Tobacco use: Secondary | ICD-10-CM

## 2024-09-28 DIAGNOSIS — E785 Hyperlipidemia, unspecified: Secondary | ICD-10-CM | POA: Diagnosis not present

## 2024-09-28 NOTE — Progress Notes (Signed)
 " Cardiology Office Note:    Date:  09/28/2024   ID:  Alice Thomas, DOB 06-07-1971, MRN [id number]  PCP:  Daryl Setter, NP  Cardiologist:  Lamar Fitch, MD    Referring MD: Daryl Setter, NP   Chief Complaint  Patient presents with   Follow-up    History of Present Illness:    Alice Thomas is a 54 y.o. female past medical history significant for dyslipidemia with LDL of 200, smoking which is still ongoing, she was referred to us  because of shortness of breath.  Evaluation included echocardiogram showed preserved left ventricle ejection fraction, she did have coronary CT angio which showed surprisingly normal coronaries without any calcifications.  She comes today to talk about his overall doing well.  Still has some shortness of breath.  She is scheduled to have pulmonary function test at the end of the month which I think is an excellent idea.  Past Medical History:  Diagnosis Date   Allergy    seasonal   Anemia    History of chicken pox    childhood   History of kidney stones    Thyroid  disease    hypothyroidism    Past Surgical History:  Procedure Laterality Date   ENDOMETRIAL BIOPSY     HERNIA REPAIR     83 months of age ? abdominal   INCISION AND DRAINAGE RETROPHYARYNGEAL ABCESS  1992 / 93   KNEE ARTHROSCOPY WITH ANTERIOR CRUCIATE LIGAMENT (ACL) REPAIR Right 09/23/2004   TONSILLECTOMY AND ADENOIDECTOMY     childhood   WISDOM TOOTH EXTRACTION  1992 / 93   this was complicated by a post operative abscess which required hospitalization/ICU care x 9 days- during college    Current Medications: Active Medications[1]   Allergies:   Patient has no known allergies.   Social History   Socioeconomic History   Marital status: Married    Spouse name: Not on file   Number of children: Not on file   Years of education: Not on file   Highest education level: Not on file  Occupational History   Not on file  Tobacco Use   Smoking status: Every Day     Current packs/day: 0.50    Average packs/day: 0.5 packs/day for 10.0 years (5.0 ttl pk-yrs)    Types: Cigarettes   Smokeless tobacco: Never  Vaping Use   Vaping status: Never Used  Substance and Sexual Activity   Alcohol use: Yes    Alcohol/week: 0.0 - 4.0 standard drinks of alcohol   Drug use: No   Sexual activity: Yes    Partners: Male    Birth control/protection: I.U.D.  Other Topics Concern   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[employer]   Married (second marriage)   No children   No pets   Enjoys volunteering and exercise but has not being doing    Social Drivers of Health   Tobacco Use: High Risk (09/28/2024)   Patient History    Smoking Tobacco Use: Every Day    Smokeless Tobacco Use: Never    Passive Exposure: Not on file  Financial Resource Strain: Low Risk (03/10/2024)   Received from [facility]   Overall Financial Resource Strain (CARDIA)    Difficulty of Paying Living Expenses: Not hard at all  Food Insecurity: No Food Insecurity (03/10/2024)   Received from [facility]   Epic    Within the past 12 months, you worried that your food would run out before you  got the money to buy more.: Never true    Within the past 12 months, the food you bought just didn't last and you didn't have money to get more.: Never true  Transportation Needs: No Transportation Needs (03/10/2024)   Received from [facility]   PRAPARE - Transportation    Lack of Transportation (Medical): No    Lack of Transportation (Non-Medical): No  Physical Activity: Not on file  Stress: Not on file  Social Connections: Not on file  Depression (PHQ2-9): High Risk (08/23/2024)   Depression (PHQ2-9)    PHQ-2 Score: 12  Alcohol Screen: Not on file  Housing: Low Risk (03/10/2024)   Received from [facility]    In the last 12 months, was there a time when you were not able to pay the mortgage or rent on time?: No    In the past 12 months, how many times have you moved where you  were living?: 0    At any time in the past 12 months, were you homeless or living in a shelter (including now)?: No  Utilities: Not At Risk (03/10/2024)   Received from [facility] Utilities    Threatened with loss of utilities: No  Health Literacy: Not on file     Family History: The patient's family history includes Alzheimer's disease in her father; Breast cancer in her paternal aunt; Crohn's disease in her father; Heart disease in her paternal grandfather; Hypothyroidism in her sister; Parkinson's disease in her father. ROS:   Please see the history of present illness.    All 14 point review of systems negative except as described per history of present illness  EKGs/Labs/Other Studies Reviewed:         Recent Labs: 04/21/2024: ALT 13; BUN 15; Creat 0.76; Potassium 4.1; Sodium 139 06/18/2024: Hemoglobin 14.4; Platelets 269; TSH 3.87  Recent Lipid Panel    Component Value Date/Time   CHOL 292 (H) 06/18/2024 1551   TRIG 79 06/18/2024 1551   HDL 74 06/18/2024 1551   CHOLHDL 3.9 06/18/2024 1551   VLDL 15 05/21/2017 1011   LDLCALC 200 (H) 06/18/2024 1551    Physical Exam:    VS:  BP 110/74   Pulse 76   Ht 5' 3.5 (1.613 m)   Wt 168 lb (76.2 kg)   SpO2 97%   BMI 29.29 kg/m     Wt Readings from Last 3 Encounters:  09/28/24 168 lb (76.2 kg)  08/23/24 168 lb (76.2 kg)  08/03/24 174 lb 9.6 oz (79.2 kg)     GEN:  Well nourished, well developed in no acute distress HEENT: Normal NECK: No JVD; No carotid bruits LYMPHATICS: No lymphadenopathy CARDIAC: RRR, no murmurs, no rubs, no gallops RESPIRATORY:  Clear to auscultation without rales, wheezing or rhonchi  ABDOMEN: Soft, non-tender, non-distended MUSCULOSKELETAL:  No edema; No deformity  SKIN: Warm and dry LOWER EXTREMITIES: no swelling NEUROLOGIC:  Alert and oriented x 3 PSYCHIATRIC:  Normal affect   ASSESSMENT:    1. SOB (shortness of breath)   2. Dyslipidemia   3. Tobacco abuse    PLAN:    In  order of problems listed above:  Shortness of breath multifactorial probably related to smoking and COPD that she probably had.  Pulmonary function test has been scheduled. Atypical chest pain surprisingly coronary CT angio showed calcium  score of 0 and normal coronaries it is surprising with her risk factors. Dyslipidemia with LDL of 200 surprisingly no coronary disease, still  I think there is value of reducing her cholesterol so she will start 10 mg of Lipitor if she did not do it before fasting lipid profile, AST LT 6 weeks. Smoking still ongoing.  She decided to quit in March.  She is very busy at her work so she went to wait until the end of March.   Medication Adjustments/Labs and Tests Ordered: Current medicines are reviewed at length with the patient today.  Concerns regarding medicines are outlined above.  No orders of the defined types were placed in this encounter.  Medication changes: No orders of the defined types were placed in this encounter.   Signed, Lamar DOROTHA Fitch, MD, [facility] 09/28/2024 4:13 PM     [facility]    [1]  Current Meds  Medication Sig   albuterol  (VENTOLIN  HFA) 108 (90 Base) MCG/ACT inhaler Inhale 2 puffs into the lungs every 6 (six) hours as needed for wheezing or shortness of breath.   aspirin  EC 81 MG tablet Take 1 tablet (81 mg total) by mouth daily. Swallow whole.   atorvastatin  (LIPITOR) 20 MG tablet Take 1 tablet (20 mg total) by mouth daily.   buPROPion  (WELLBUTRIN  XL) 150 MG 24 hr tablet Take 1 tablet (150 mg total) by mouth daily.   Cholecalciferol (VITAMIN D3) 75 MCG (3000 UT) TABS Take 1 tablet by mouth daily at 6 (six) AM.   estradiol (VIVELLE-DOT) 0.05 MG/24HR patch 1 patch 2 (two) times a week.   eszopiclone  (LUNESTA ) 1 MG TABS tablet Take 1 tablet (1 mg total) by mouth at bedtime as needed for sleep. Take immediately before bedtime   levothyroxine  (SYNTHROID ) 175 MCG tablet Take 1 tablet (175 mcg total) by mouth  daily.   progesterone (PROMETRIUM) 100 MG capsule Take 100 mg by mouth at bedtime.   sertraline  (ZOLOFT ) 100 MG tablet Take 1.5 tablets (150 mg total) by mouth daily.   "

## 2024-09-28 NOTE — Patient Instructions (Signed)

## 2024-09-30 ENCOUNTER — Other Ambulatory Visit: Payer: Self-pay | Admitting: Family

## 2024-09-30 DIAGNOSIS — F419 Anxiety disorder, unspecified: Secondary | ICD-10-CM

## 2024-10-13 ENCOUNTER — Ambulatory Visit

## 2024-10-13 ENCOUNTER — Ambulatory Visit: Admitting: Pulmonary Disease

## 2024-10-13 ENCOUNTER — Encounter: Payer: Self-pay | Admitting: Pulmonary Disease

## 2024-10-13 VITALS — BP 106/56 | HR 87 | Temp 97.5°F | Ht 63.5 in | Wt 166.8 lb

## 2024-10-13 DIAGNOSIS — F17210 Nicotine dependence, cigarettes, uncomplicated: Secondary | ICD-10-CM

## 2024-10-13 DIAGNOSIS — F419 Anxiety disorder, unspecified: Secondary | ICD-10-CM

## 2024-10-13 DIAGNOSIS — R0602 Shortness of breath: Secondary | ICD-10-CM | POA: Diagnosis not present

## 2024-10-13 LAB — PULMONARY FUNCTION TEST
DL/VA % pred: 107 %
DL/VA: 4.57 ml/min/mmHg/L
DLCO unc % pred: 112 %
DLCO unc: 23.21 ml/min/mmHg
FEF 25-75 Post: 2.8 L/s
FEF 25-75 Pre: 2.45 L/s
FEF2575-%Change-Post: 13 %
FEF2575-%Pred-Post: 107 %
FEF2575-%Pred-Pre: 94 %
FEV1-%Change-Post: 1 %
FEV1-%Pred-Post: 109 %
FEV1-%Pred-Pre: 107 %
FEV1-Post: 2.97 L
FEV1-Pre: 2.92 L
FEV1FVC-%Change-Post: 3 %
FEV1FVC-%Pred-Pre: 99 %
FEV6-%Change-Post: -1 %
FEV6-%Pred-Post: 108 %
FEV6-%Pred-Pre: 110 %
FEV6-Post: 3.65 L
FEV6-Pre: 3.69 L
FEV6FVC-%Change-Post: 0 %
FEV6FVC-%Pred-Post: 102 %
FEV6FVC-%Pred-Pre: 102 %
FVC-%Change-Post: -1 %
FVC-%Pred-Post: 105 %
FVC-%Pred-Pre: 107 %
FVC-Post: 3.66 L
FVC-Pre: 3.71 L
Post FEV1/FVC ratio: 81 %
Post FEV6/FVC ratio: 100 %
Pre FEV1/FVC ratio: 79 %
Pre FEV6/FVC Ratio: 100 %
RV % pred: 99 %
RV: 1.86 L
TLC % pred: 108 %
TLC: 5.48 L

## 2024-10-13 NOTE — Progress Notes (Signed)
 Full pft performed today

## 2024-10-13 NOTE — Progress Notes (Signed)
 "              Alice Thomas    [id number]    12/08/1970  Primary Care Physician:O'Sullivan, Eleanor, NP  Referring Physician: Neda Jennet LABOR, MD [address] [place],  [place] [address]  Chief complaint:   Patient being seen for shortness of breath   HPI:  Symptoms remained about the same Feels is not taking her breath sometimes  Smokes about half a pack a day, we did talk about quitting smoking the last time she was here she still working on this  Has no underlying lung disease  Had a pulmonary function test Study shows normal lung functions  Had a cardiac CT in the workup of shortness of breath that was unremarkable as well  Was seen previously in the office here for insomnia, reports less concern for sleep disordered breathing she feels when she is able to get to sleep she sleeps well Wakes up feeling rested, not sleepy during the day not tired during the day Does snore but no witnessed apneas Denies any significant dryness of her mouth in the morning No headaches in the morning  Weight has been stable  Albuterol  was prescribed but she has not needed to use it  Outpatient Encounter Medications as of 10/13/2024  Medication Sig   albuterol  (VENTOLIN  HFA) 108 (90 Base) MCG/ACT inhaler Inhale 2 puffs into the lungs every 6 (six) hours as needed for wheezing or shortness of breath.   aspirin  EC 81 MG tablet Take 1 tablet (81 mg total) by mouth daily. Swallow whole.   atorvastatin  (LIPITOR) 20 MG tablet Take 1 tablet (20 mg total) by mouth daily.   buPROPion  (WELLBUTRIN  XL) 150 MG 24 hr tablet Take 1 tablet (150 mg total) by mouth daily.   Cholecalciferol (VITAMIN D3) 75 MCG (3000 UT) TABS Take 1 tablet by mouth daily at 6 (six) AM.   estradiol (VIVELLE-DOT) 0.05 MG/24HR patch 1 patch 2 (two) times a week.   eszopiclone  (LUNESTA ) 1 MG TABS tablet Take 1 tablet (1 mg total) by mouth at bedtime as needed for sleep. Take immediately before bedtime   levothyroxine   (SYNTHROID ) 175 MCG tablet Take 1 tablet (175 mcg total) by mouth daily.   progesterone (PROMETRIUM) 100 MG capsule Take 100 mg by mouth at bedtime.   sertraline  (ZOLOFT ) 100 MG tablet Take 1.5 tablets (150 mg total) by mouth daily.   Varenicline  Tartrate, Starter, (CHANTIX  STARTING MONTH PAK) 0.5 MG X 11 & 1 MG X 42 TBPK Take one 0.5 mg tablet by mouth once daily for 3 days, then increase to one 0.5 mg tablet twice daily for 4 days, then increase to one 1 mg tablet twice daily. (Patient not taking: Reported on 10/13/2024)   No facility-administered encounter medications on file as of 10/13/2024.    Allergies as of 10/13/2024   (No Known Allergies)    Past Medical History:  Diagnosis Date   Allergy    seasonal   Anemia    History of chicken pox    childhood   History of kidney stones    Thyroid  disease    hypothyroidism    Past Surgical History:  Procedure Laterality Date   ENDOMETRIAL BIOPSY     HERNIA REPAIR     21 months of age ? abdominal   INCISION AND DRAINAGE RETROPHYARYNGEAL ABCESS  1992 / 93   KNEE ARTHROSCOPY WITH ANTERIOR CRUCIATE LIGAMENT (ACL) REPAIR Right 09/23/2004   TONSILLECTOMY AND ADENOIDECTOMY  childhood   WISDOM TOOTH EXTRACTION  1992 / 67   this was complicated by a post operative abscess which required hospitalization/ICU care x 9 days- during college    Family History  Problem Relation Age of Onset   Crohn's disease Father    Alzheimer's disease Father    Parkinson's disease Father    Hypothyroidism Sister    Heart disease Paternal Grandfather    Breast cancer Paternal Aunt     Social History   Socioeconomic History   Marital status: Married    Spouse name: Not on file   Number of children: Not on file   Years of education: Not on file   Highest education level: Not on file  Occupational History   Not on file  Tobacco Use   Smoking status: Every Day    Current packs/day: 0.50    Average packs/day: 0.5 packs/day for 10.0 years (5.0  ttl pk-yrs)    Types: Cigarettes   Smokeless tobacco: Never  Vaping Use   Vaping status: Never Used  Substance and Sexual Activity   Alcohol use: Yes    Alcohol/week: 0.0 - 4.0 standard drinks of alcohol   Drug use: No   Sexual activity: Yes    Partners: Male    Birth control/protection: I.U.D.  Other Topics Concern   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[employer]   Married (second marriage)   No children   No pets   Enjoys volunteering and exercise but has not being doing    Social Drivers of Health   Tobacco Use: High Risk (10/13/2024)   Patient History    Smoking Tobacco Use: Every Day    Smokeless Tobacco Use: Never    Passive Exposure: Not on file  Financial Resource Strain: Low Risk (03/10/2024)   Received from [facility]   Overall Financial Resource Strain (CARDIA)    Difficulty of Paying Living Expenses: Not hard at all  Food Insecurity: No Food Insecurity (03/10/2024)   Received from [facility]   Epic    Within the past 12 months, you worried that your food would run out before you got the money to buy more.: Never true    Within the past 12 months, the food you bought just didn't last and you didn't have money to get more.: Never true  Transportation Needs: No Transportation Needs (03/10/2024)   Received from [facility]    Lack of Transportation (Medical): No    Lack of Transportation (Non-Medical): No  Physical Activity: Not on file  Stress: Not on file  Social Connections: Not on file  Intimate Partner Violence: Not on file  Depression (PHQ2-9): High Risk (08/23/2024)   Depression (PHQ2-9)    PHQ-2 Score: 12  Alcohol Screen: Not on file  Housing: Low Risk (03/10/2024)   Received [facility]    In the last 12 months, was there a time when you were not able to pay the mortgage or rent on time?: No    In the past 12 months, how many times have you moved where you were living?: 0    At any time in the  past 12 months, were you homeless or living in a shelter (including now)?: No  Utilities: Not At Risk (03/10/2024)   Received from [facility] Utilities    Threatened with loss of utilities: No  Health Literacy: Not on file    Review  of Systems  Constitutional:  Positive for fatigue.  Respiratory:  Positive for shortness of breath.   Psychiatric/Behavioral:  Positive for sleep disturbance.     Vitals:   10/13/24 1455  BP: (!) 106/56  Pulse: 87  Temp: (!) 97.5 F (36.4 C)  SpO2: 99%     Physical Exam Constitutional:      Appearance: Normal appearance.  HENT:     Head: Normocephalic.     Nose: Nose normal.     Mouth/Throat:     Mouth: Mucous membranes are moist.  Eyes:     General: No scleral icterus. Cardiovascular:     Rate and Rhythm: Normal rate and regular rhythm.     Heart sounds: No murmur heard.    No friction rub.  Pulmonary:     Effort: No respiratory distress.     Breath sounds: No stridor. No wheezing or rhonchi.  Musculoskeletal:     Cervical back: Normal range of motion. No rigidity or tenderness.  Neurological:     Mental Status: She is alert.     Cranial Nerves: No cranial nerve deficit.  Psychiatric:        Mood and Affect: Mood normal.    Epworth Sleepiness Scale of 0 Data Reviewed: PFT was reviewed with the patient today showing no obstruction, no significant bronchodilator response, no restriction, normal diffusing capacity  Cardiac CT was reviewed showing no significant abnormality-reviewed by myself  CT chest 06/16/2024 shows evidence of centrilobular emphysema  Assessment/Plan: Shortness of breath, active smoker  Encouraged to continue working on quitting smoking  Reports less concern for sleep disordered breathing - We discussed may not need to go through a sleep study but if symptoms were to change this can be considered  Anxiety - On Zoloft  - I think further optimizing treatment for this will help symptoms  May use  albuterol  as needed even though she has not needed much  Can follow-up as needed otherwise can follow-up with her primary doctor Continue optimization  of management of anxiety  Jennet Epley MD [facility] 10/13/2024, 3:09 PM  CC: Epley Jennet LABOR, MD   "

## 2024-10-13 NOTE — Patient Instructions (Signed)
 Follow-up only as needed  Greater activity as tolerated  Continue working on quitting smoking  Your pulmonary function test today as reviewed was within normal limits Your CT scan as we reviewed previously was also normal  Use albuterol  as needed

## 2024-10-13 NOTE — Patient Instructions (Signed)
 Full pft performed today

## 2024-11-26 ENCOUNTER — Ambulatory Visit: Admitting: Family
